# Patient Record
Sex: Female | Born: 1980 | Race: Black or African American | Hispanic: No | Marital: Married | Smoking: Never smoker
Health system: Southern US, Community
[De-identification: ages and names within clinical notes are randomized; demographics above are authoritative.]

## PROBLEM LIST (undated history)

## (undated) DIAGNOSIS — I10 Essential (primary) hypertension: Secondary | ICD-10-CM

## (undated) DIAGNOSIS — G43909 Migraine, unspecified, not intractable, without status migrainosus: Secondary | ICD-10-CM

## (undated) DIAGNOSIS — R7303 Prediabetes: Secondary | ICD-10-CM

## (undated) HISTORY — DX: Essential (primary) hypertension: I10

## (undated) HISTORY — DX: Migraine, unspecified, not intractable, without status migrainosus: G43.909

## (undated) HISTORY — DX: Prediabetes: R73.03

---

## 2013-01-02 ENCOUNTER — Ambulatory Visit: Payer: 59 | Admitting: *Deleted

## 2013-01-02 ENCOUNTER — Telehealth: Payer: Self-pay | Admitting: Family Medicine

## 2013-01-02 DIAGNOSIS — E049 Nontoxic goiter, unspecified: Secondary | ICD-10-CM

## 2013-01-02 DIAGNOSIS — Z833 Family history of diabetes mellitus: Secondary | ICD-10-CM

## 2013-01-02 DIAGNOSIS — R221 Localized swelling, mass and lump, neck: Secondary | ICD-10-CM

## 2013-01-02 DIAGNOSIS — R220 Localized swelling, mass and lump, head: Secondary | ICD-10-CM

## 2013-01-02 LAB — CBC WITH DIFFERENTIAL/PLATELET
Basophils Absolute: 0 10*3/uL (ref 0.0–0.1)
Basophils Relative: 1 % (ref 0–1)
Eosinophils Absolute: 0 10*3/uL (ref 0.0–0.7)
HCT: 40.3 % (ref 36.0–46.0)
Hemoglobin: 13.5 g/dL (ref 12.0–15.0)
Lymphs Abs: 1.9 10*3/uL (ref 0.7–4.0)
MCH: 26.8 pg (ref 26.0–34.0)
MCHC: 33.5 g/dL (ref 30.0–36.0)
Monocytes Relative: 9 % (ref 3–12)
Neutro Abs: 2.4 10*3/uL (ref 1.7–7.7)
Neutrophils Relative %: 49 % (ref 43–77)
Platelets: 353 10*3/uL (ref 150–400)
RBC: 5.03 MIL/uL (ref 3.87–5.11)

## 2013-01-02 NOTE — Telephone Encounter (Signed)
Spoke with Teresa Roach today in the clinic. For the past few mos she has felt like her thyroid area is enlarged. No problems swallowing and no lymphadenopathy. She feels well and has no h/o thyroid problems. She'd like to check some basic labs and, after getting results, likely get an u/s of her neck. I agree with this plan. We'll also look at her a1c since she has a FH of DM.  She declines a vitamin D, admitting that even if it were low she probably wouldn't take the supplement.

## 2013-01-03 ENCOUNTER — Encounter: Payer: Self-pay | Admitting: Family Medicine

## 2013-01-03 ENCOUNTER — Other Ambulatory Visit: Payer: Self-pay | Admitting: Family Medicine

## 2013-01-03 DIAGNOSIS — R7303 Prediabetes: Secondary | ICD-10-CM

## 2013-01-03 LAB — COMPREHENSIVE METABOLIC PANEL
ALT: 15 U/L (ref 0–35)
Albumin: 4.1 g/dL (ref 3.5–5.2)
Alkaline Phosphatase: 45 U/L (ref 39–117)
CO2: 27 mEq/L (ref 19–32)
Creat: 0.89 mg/dL (ref 0.50–1.10)
Glucose, Bld: 84 mg/dL (ref 70–99)
Potassium: 4.3 mEq/L (ref 3.5–5.3)
Sodium: 137 mEq/L (ref 135–145)
Total Bilirubin: 0.3 mg/dL (ref 0.3–1.2)
Total Protein: 7.5 g/dL (ref 6.0–8.3)

## 2013-01-03 LAB — HEMOGLOBIN A1C: Mean Plasma Glucose: 126 mg/dL — ABNORMAL HIGH (ref <117)

## 2013-01-03 LAB — T3, FREE: T3, Free: 3 pg/mL (ref 2.3–4.2)

## 2013-01-03 LAB — TSH: TSH: 1.38 u[IU]/mL (ref 0.350–4.500)

## 2013-01-03 MED ORDER — BLOOD GLUCOSE METER KIT: PACK | Status: DC

## 2013-01-09 ENCOUNTER — Other Ambulatory Visit: Payer: Self-pay | Admitting: Family Medicine

## 2013-01-09 DIAGNOSIS — J209 Acute bronchitis, unspecified: Secondary | ICD-10-CM

## 2013-01-09 MED ORDER — ALBUTEROL SULFATE HFA 108 (90 BASE) MCG/ACT IN AERS: 2.0000 | INHALATION_SPRAY | RESPIRATORY_TRACT | Status: DC | PRN

## 2013-01-09 MED ORDER — AZITHROMYCIN 250 MG PO TABS: ORAL_TABLET | ORAL | Status: DC

## 2013-01-09 MED ORDER — BENZONATATE 200 MG PO CAPS: 200.0000 mg | ORAL_CAPSULE | Freq: Two times a day (BID) | ORAL | Status: DC | PRN

## 2013-01-09 NOTE — Progress Notes (Signed)
Pt concerned about bronchitis, which she seems to get every year. She has had uri sx for a couple weeks and yesterday began coughing and feeling shob with exertion. No fevers or GI sx. She says nights are worse than days. In the past, a z pack and albuterol hfa have been required.   Lungs are ctab currently and she has good air movement.   I agree that she probably has gotten a secondary infection of bronchitis. I have sent in a z pack and albuterol inhaler. I have also sent in tessalon pearles. Sharmel will let me know if she is not feeling better soon or has any new concerns.

## 2013-01-16 ENCOUNTER — Other Ambulatory Visit: Payer: Self-pay | Admitting: Family Medicine

## 2013-01-16 DIAGNOSIS — R221 Localized swelling, mass and lump, neck: Secondary | ICD-10-CM

## 2013-01-17 ENCOUNTER — Ambulatory Visit (HOSPITAL_COMMUNITY)
Admission: RE | Admit: 2013-01-17 | Discharge: 2013-01-17 | Disposition: A | Discharge status: Home / Self Care | Payer: 59 | Source: Ambulatory Visit | Attending: Family Medicine | Admitting: Family Medicine

## 2013-01-17 DIAGNOSIS — E049 Nontoxic goiter, unspecified: Secondary | ICD-10-CM | POA: Insufficient documentation

## 2013-01-17 DIAGNOSIS — R599 Enlarged lymph nodes, unspecified: Secondary | ICD-10-CM | POA: Insufficient documentation

## 2013-01-17 DIAGNOSIS — R221 Localized swelling, mass and lump, neck: Secondary | ICD-10-CM

## 2013-01-17 DIAGNOSIS — R220 Localized swelling, mass and lump, head: Secondary | ICD-10-CM | POA: Insufficient documentation

## 2013-01-20 ENCOUNTER — Encounter: Payer: Self-pay | Admitting: Family Medicine

## 2013-01-21 ENCOUNTER — Telehealth: Payer: Self-pay | Admitting: Family Medicine

## 2013-01-21 ENCOUNTER — Encounter: Payer: Self-pay | Admitting: Family Medicine

## 2013-01-21 NOTE — Progress Notes (Signed)
Patient ID: Teresa Roach, female   DOB: [birth year], 32 y.o.   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 It has been going on for about 3 months total. She has had 2 mos of fatigue, prompting tsh to be checked. 1 month ago she got a uri and thought the full feeling in her throat might be related to this but when the feeling persisted she felt she should be looked at.   She denied any palpitations, GI sx, or weight loss. She is working on losing weight. She does endorse a long history of irregular periods, brittle nails, weight fluctuation, sleep problems, and heat intolerance.  A recent a1c showed that she is pre-diabetic and she is working on her weight and diet regarding this. She used to take Propranolol and has a h/o htn but is not on meds at this time.  O - Her HR was 95 and RR 16 Constitutional: She is oriented to person, place, and time. She appears well-developed and well-nourished.  HENT:  Neck: Normal range of motion. Neck supple. No obvious thyromegaly present. No obvious lymphadenopathy although left sided some shotty nodes present. Cardiovascular: Normal rate, regular rhythm and normal heart sounds.   Pulmonary/Chest: Effort normal and breath sounds normal.  Skin: Skin is warm and dry.  Psychiatric: She has a normal mood and affect. Her behavior is normal.   A/P Neck fullness - we checked her tsh and T4, which were wnl. We got an u/s which showed:  8 mm cystic nodule noted in left thyroid lobe. Thyroid gland appears  otherwise normal. Mild bilateral cervical adenopathy is noted that  most likely is benign and inflammatory in origin.  Given cystic nodule, We agreed that she should see ENT for further evaluation.  Teresa Roach will f/u with me in the office for a scheduled appointment or with another pcp of her choosing for primary care in the next few  months.

## 2013-01-30 ENCOUNTER — Encounter (INDEPENDENT_AMBULATORY_CARE_PROVIDER_SITE_OTHER): Payer: Self-pay

## 2013-01-30 ENCOUNTER — Ambulatory Visit (INDEPENDENT_AMBULATORY_CARE_PROVIDER_SITE_OTHER): Payer: 59 | Admitting: Otolaryngology

## 2013-01-30 DIAGNOSIS — D487 Neoplasm of uncertain behavior of other specified sites: Secondary | ICD-10-CM

## 2013-01-30 DIAGNOSIS — D449 Neoplasm of uncertain behavior of unspecified endocrine gland: Secondary | ICD-10-CM

## 2013-02-20 NOTE — Telephone Encounter (Signed)
Per pt request - see chart notes

## 2013-03-12 ENCOUNTER — Other Ambulatory Visit: Payer: Self-pay | Admitting: Family Medicine

## 2013-03-12 ENCOUNTER — Ambulatory Visit (HOSPITAL_COMMUNITY)
Admission: RE | Admit: 2013-03-12 | Discharge: 2013-03-12 | Disposition: A | Discharge status: Home / Self Care | Payer: 59 | Source: Ambulatory Visit | Attending: Family Medicine | Admitting: Family Medicine

## 2013-03-12 DIAGNOSIS — M25532 Pain in left wrist: Secondary | ICD-10-CM

## 2013-03-12 DIAGNOSIS — M25539 Pain in unspecified wrist: Secondary | ICD-10-CM | POA: Insufficient documentation

## 2013-03-12 NOTE — Progress Notes (Signed)
Pt fell on the ice this morning. Says she was leaving for work and fell on outstretched left hand. Exam revealed mild ttp over snuffbox. Slightly limited ROM. Minimal ttp over ant wrist, none on dorsum of wrist. No ttp over metacarpals and no pain with axial pressure. Normal cap refill and good pulses. 4/5 grip strength. Neuro intact.   We have discussed options and she would like to go ahead with XR. If negative will splint and see how it feels in a week - reXR if not better.

## 2013-03-13 ENCOUNTER — Ambulatory Visit (INDEPENDENT_AMBULATORY_CARE_PROVIDER_SITE_OTHER): Payer: 59 | Admitting: Otolaryngology

## 2013-03-13 DIAGNOSIS — R599 Enlarged lymph nodes, unspecified: Secondary | ICD-10-CM

## 2013-03-13 DIAGNOSIS — D449 Neoplasm of uncertain behavior of unspecified endocrine gland: Secondary | ICD-10-CM

## 2013-03-13 DIAGNOSIS — D487 Neoplasm of uncertain behavior of other specified sites: Secondary | ICD-10-CM

## 2013-03-19 ENCOUNTER — Other Ambulatory Visit: Payer: Self-pay | Admitting: Family Medicine

## 2013-03-19 DIAGNOSIS — I10 Essential (primary) hypertension: Secondary | ICD-10-CM

## 2013-03-19 MED ORDER — AMLODIPINE BESYLATE 2.5 MG PO TABS: 2.5000 mg | ORAL_TABLET | Freq: Every day | ORAL | Status: DC

## 2013-03-19 NOTE — Progress Notes (Signed)
Pt concerned today that her BP is high. Says when she was at the ENT's office last month it was elevated to 155/85 and she has a h/o HTN. In the past she took metoprolol but had difficulty loseing weight so does not want to take a BB anymore. She has been off BPmeds since May 2014. Today feels like her head is swimmy and has had a headache for a few days. No focal neuro sx. PE wnl. BP 142/84 manual. HR 90. She doesn't want to take ACE-I or HCTZ or ARB because she doesn't want to have labs, although we discussed that even with a CCB she would occasionally need at least a bmp checked. She understands this and we'll start norvasc 2.5mg  daily and see how she does. CMP in 11/14 was wnl.

## 2013-06-05 ENCOUNTER — Ambulatory Visit (INDEPENDENT_AMBULATORY_CARE_PROVIDER_SITE_OTHER): Payer: 59 | Admitting: Otolaryngology

## 2013-08-07 DIAGNOSIS — L0591 Pilonidal cyst without abscess: Secondary | ICD-10-CM | POA: Insufficient documentation

## 2013-08-11 LAB — HM HEPATITIS C SCREENING LAB: HM Hepatitis Screen: NEGATIVE

## 2015-01-11 IMAGING — US US SOFT TISSUE HEAD/NECK
1 series · 14 of 25 positions shown · non-contrast
Comparison: None.

CLINICAL DATA: Neck swelling.

EXAM:
THYROID ULTRASOUND
TECHNIQUE: Ultrasound examination of the thyroid gland and adjacent soft
tissues was performed.

[Series 1: us soft tissue head/neck · 0.04mm/px · 14 of 57 slices shown]
[im 1/57]
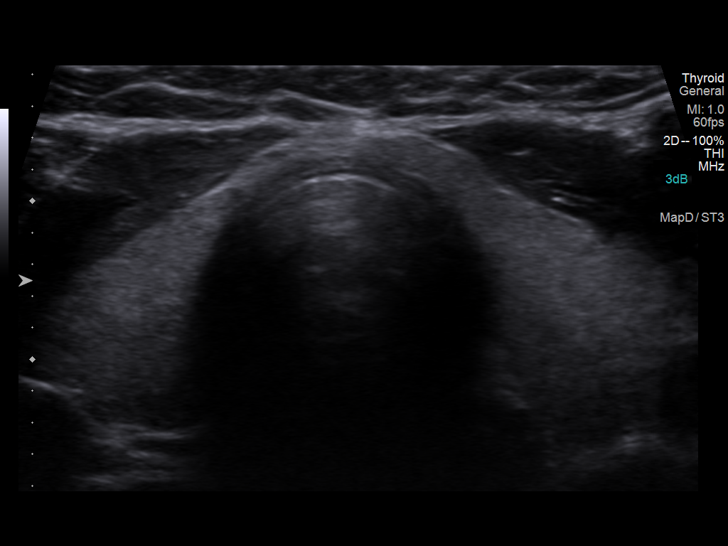
[im 5/57]
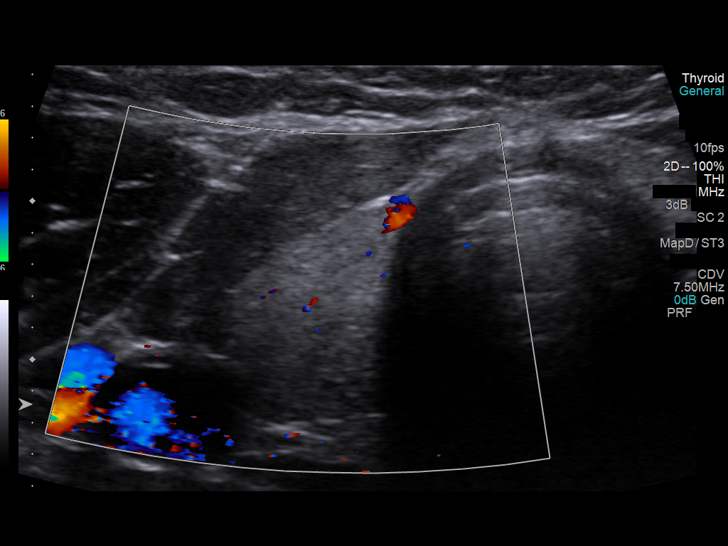
[im 10/57]
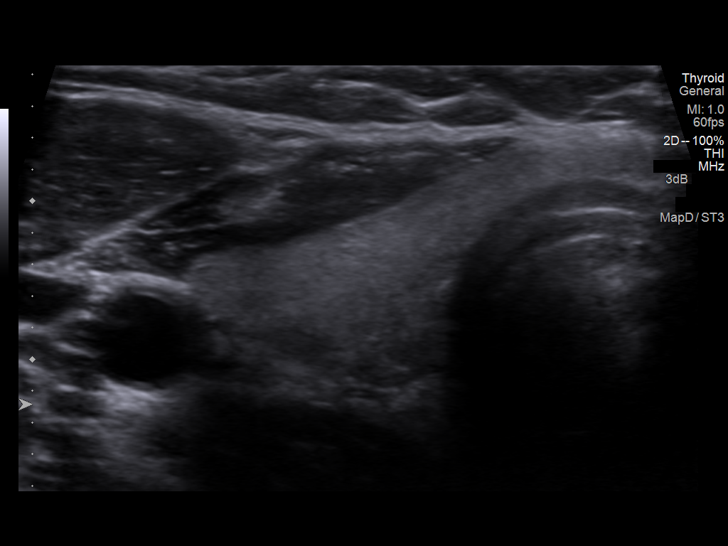
[im 15/57]
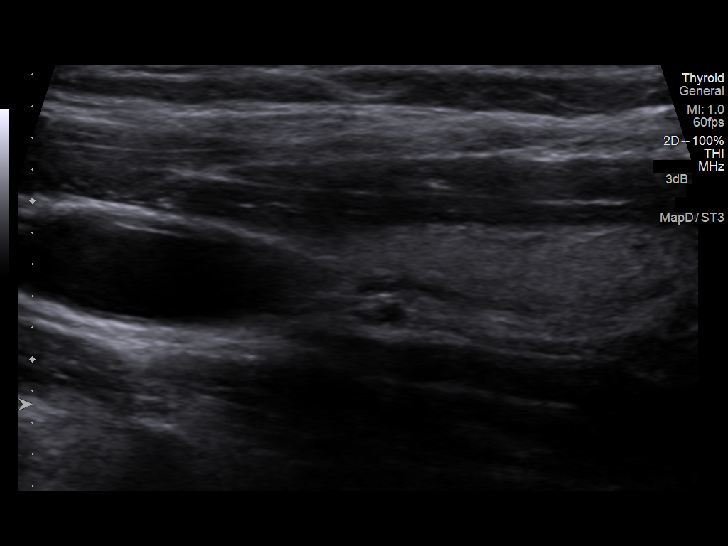
[im 19/57]
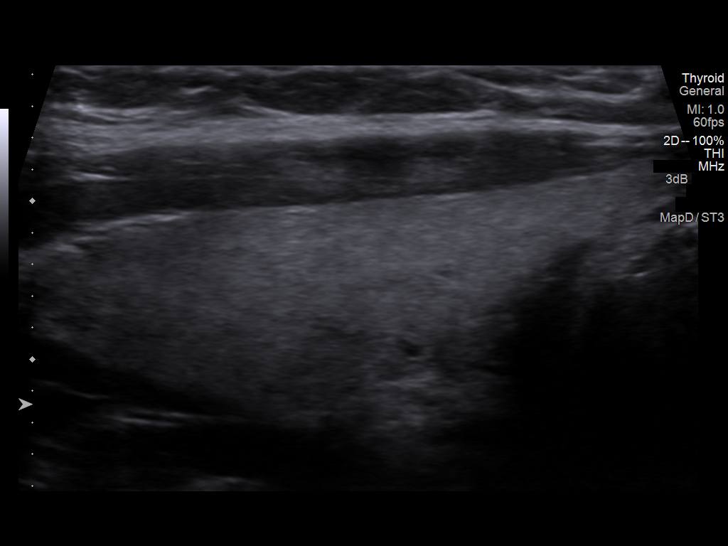
[im 22/57]
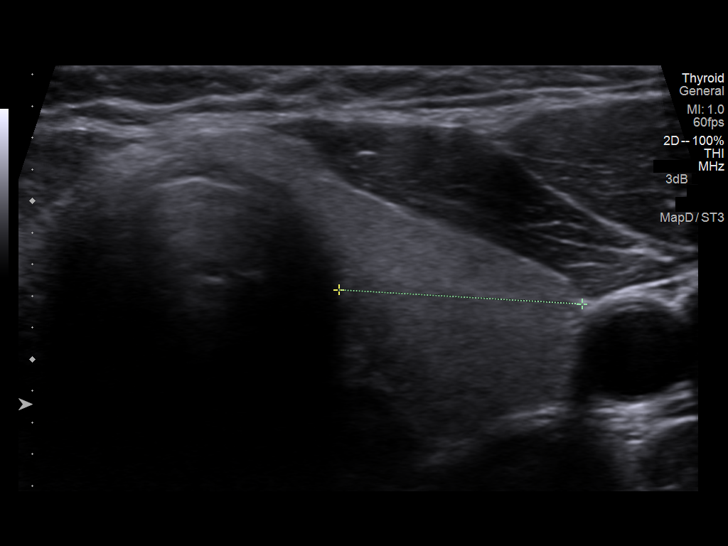
[im 26/57]
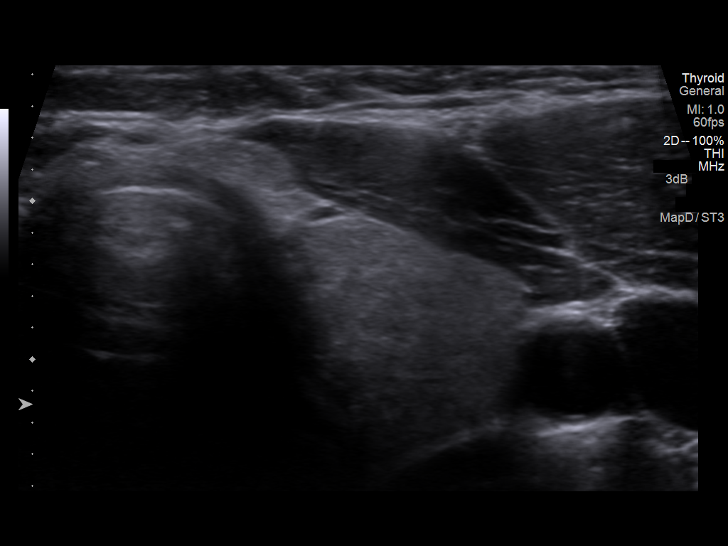
[im 31/57]
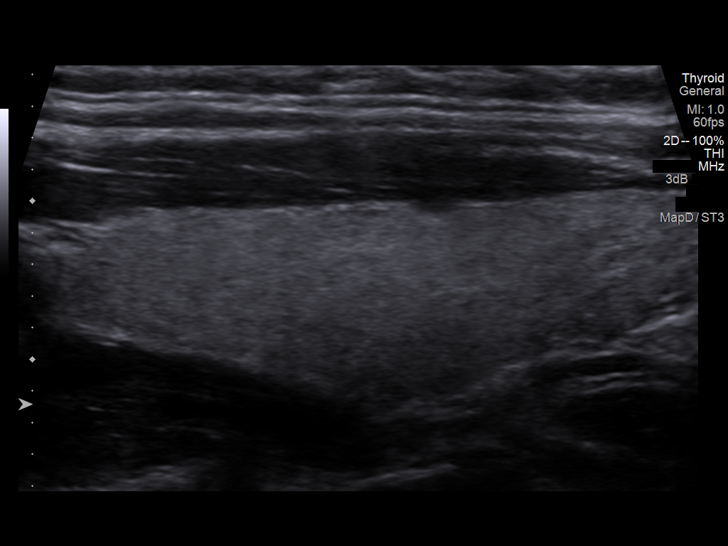
[im 36/57]
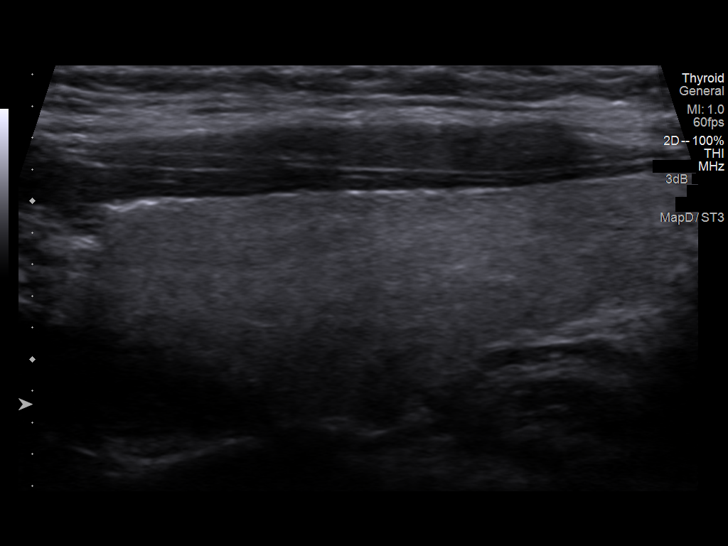
[im 38/57]
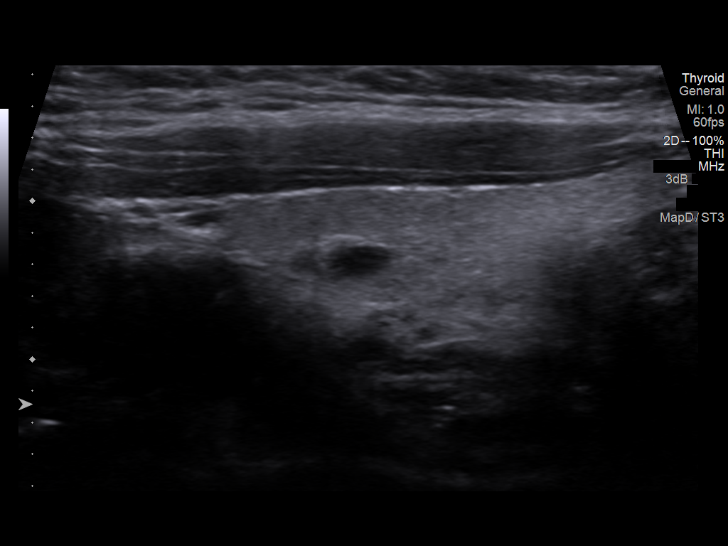
[im 43/57]
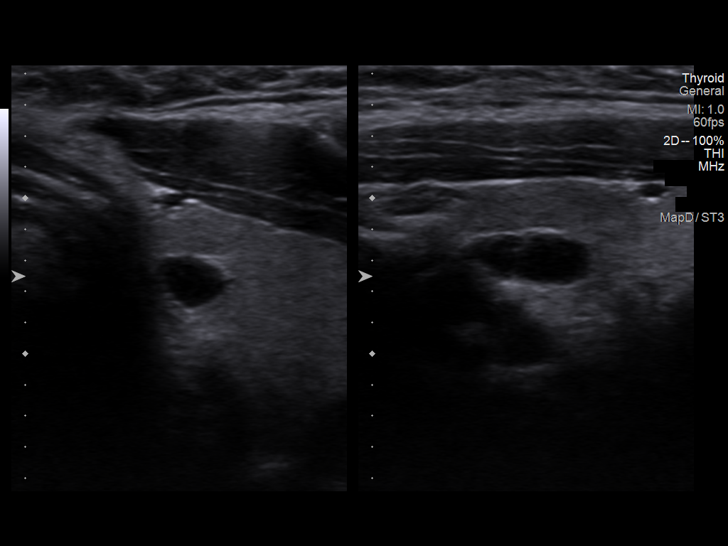
[im 47/57]
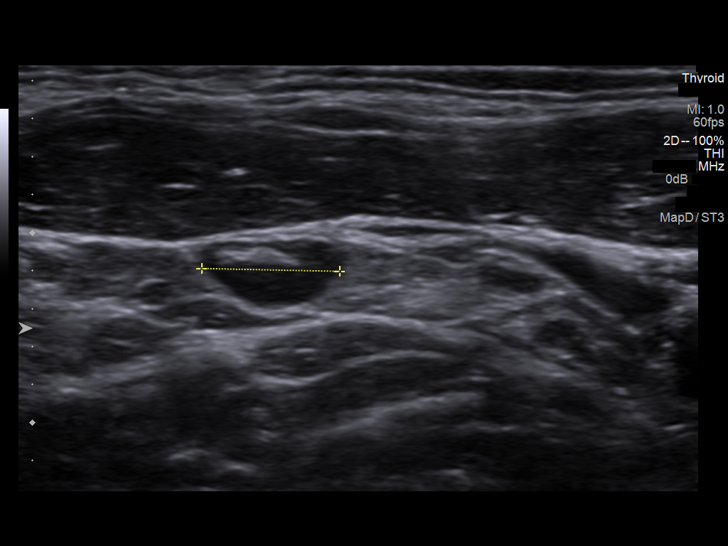
[im 52/57]
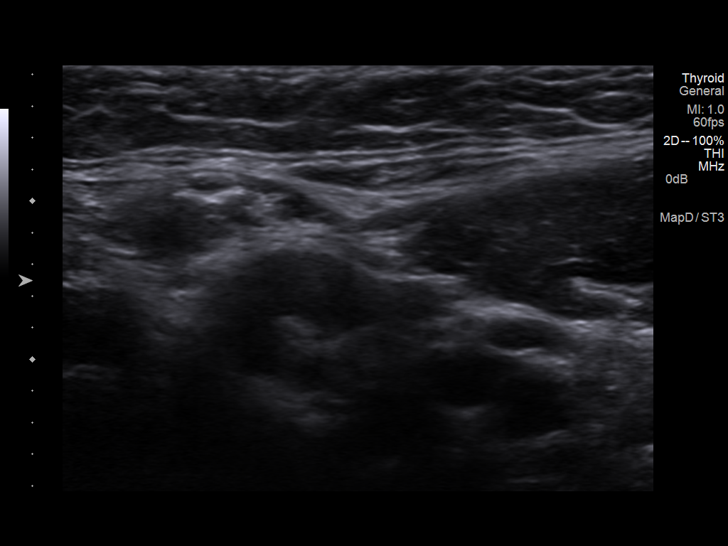
[im 57/57]
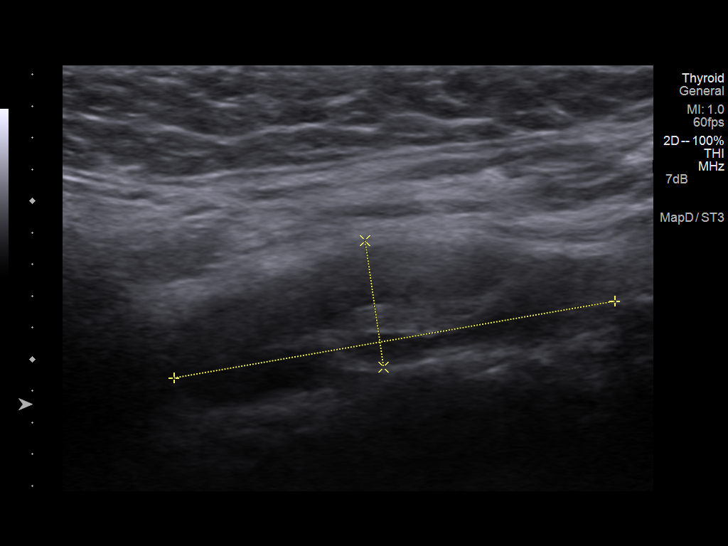

[14 of 25 positions shown; findings below may reference images not displayed]

FINDINGS: Right thyroid lobe

Measurements: 4.0 x 1.3 x 1.2 cm.  No nodules visualized.

Left thyroid lobe

Measurements: 4.1 x 1.5 x 1.3 cm. Cystic nodule measuring 8 x 4 x 3
mm is noted in the mid pole of the left thyroid lobe.

Isthmus

Thickness: 2 mm.  No nodules visualized.

Lymphadenopathy

Bilateral large lymph nodes are noted, with the largest on the right
measuring 1.8 x 0.7 x 0.3 cm. The largest on the left measures 2.8 x
1.1 x 0.8 cm. These lymph nodes do demonstrate fatty hila and most
likely are benign or inflammatory in origin, given that their minor
axis measures less than 1 cm.
IMPRESSION: 8 mm cystic nodule noted in left thyroid lobe. Thyroid gland appears
otherwise normal. Mild bilateral cervical adenopathy is noted that
most likely is benign and inflammatory in origin.

## 2016-03-24 DIAGNOSIS — M51369 Other intervertebral disc degeneration, lumbar region without mention of lumbar back pain or lower extremity pain: Secondary | ICD-10-CM | POA: Insufficient documentation

## 2016-03-24 DIAGNOSIS — M5416 Radiculopathy, lumbar region: Secondary | ICD-10-CM | POA: Insufficient documentation

## 2021-12-11 LAB — HM PAP SMEAR: HM Pap smear: NORMAL

## 2022-06-28 DIAGNOSIS — N938 Other specified abnormal uterine and vaginal bleeding: Secondary | ICD-10-CM | POA: Diagnosis not present

## 2022-06-28 DIAGNOSIS — D251 Intramural leiomyoma of uterus: Secondary | ICD-10-CM | POA: Diagnosis not present

## 2022-06-28 DIAGNOSIS — R9389 Abnormal findings on diagnostic imaging of other specified body structures: Secondary | ICD-10-CM | POA: Diagnosis not present

## 2022-06-28 DIAGNOSIS — R102 Pelvic and perineal pain: Secondary | ICD-10-CM | POA: Diagnosis not present

## 2022-07-18 DIAGNOSIS — N838 Other noninflammatory disorders of ovary, fallopian tube and broad ligament: Secondary | ICD-10-CM | POA: Diagnosis not present

## 2022-08-11 ENCOUNTER — Ambulatory Visit (HOSPITAL_BASED_OUTPATIENT_CLINIC_OR_DEPARTMENT_OTHER): Payer: Commercial Managed Care - PPO | Admitting: Student

## 2022-08-11 ENCOUNTER — Encounter (HOSPITAL_BASED_OUTPATIENT_CLINIC_OR_DEPARTMENT_OTHER): Payer: Self-pay | Admitting: Student

## 2022-08-11 ENCOUNTER — Ambulatory Visit (HOSPITAL_BASED_OUTPATIENT_CLINIC_OR_DEPARTMENT_OTHER): Payer: Commercial Managed Care - PPO

## 2022-08-11 DIAGNOSIS — M25561 Pain in right knee: Secondary | ICD-10-CM

## 2022-08-12 NOTE — Progress Notes (Signed)
Chief Complaint: Right knee pain     History of Present Illness:    Teresa Roach is a pleasant 42 y.o. female presenting today for evaluation of right knee pain.  Patient states that she has had pain in this knee for years.  She does report however that 4 days ago she came down on the knee awkwardly from a jump during a workout at burn [facility].  She did have immediate pain and difficulty weightbearing.  She is continued having a dull ache in the medial side of the knee that is moderate in severity.  She does note an increase in popping sensation as well as weakness and feeling of instability.  She has tried utilizing a knee brace as well as icing and Aleve.  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.   Surgical History:   None  PMH/PSH/Family History/Social History/Meds/Allergies:    Past Medical History:  Diagnosis Date   Hypertension    Migraine    Pre-diabetes    History reviewed. No pertinent surgical history. Social History   Socioeconomic History   Marital status: Married    Spouse name: Not on file   Number of children: Not on file   Years of education: Not on file   Highest education level: Not on file  Occupational History   Not on file  Tobacco Use   Smoking status: Not on file   Smokeless tobacco: Not on file  Substance and Sexual Activity   Alcohol use: Not on file    Comment: very rare drink   Drug use: No   Sexual activity: Yes  Other Topics Concern   Not on file  Social History Narrative   ** Merged History Encounter **       Social Determinants of Health   Financial Resource Strain: Not on file  Food Insecurity: Not on file  Transportation Needs: Not on file  Physical Activity: Not on file  Stress: Not on file  Social Connections: Not on file   Family History  Problem Relation Age of Onset   Diabetes Mother    Hyperlipidemia Mother    Hypertension Mother    Hyperlipidemia Father     Hypertension Father    Not on File Current Outpatient Medications  Medication Sig Dispense Refill   albuterol (PROVENTIL HFA;VENTOLIN HFA) 108 (90 BASE) MCG/ACT inhaler Inhale 2 puffs into the lungs every 4 (four) hours as needed for wheezing or shortness of breath. 1 Inhaler 1   amLODipine (NORVASC) 2.5 MG tablet Take 1 tablet (2.5 mg total) by mouth daily. 30 tablet 0   benzonatate (TESSALON) 200 MG capsule Take 1 capsule (200 mg total) by mouth 2 (two) times daily as needed for cough. 20 capsule 0   Blood Glucose Monitoring Suppl (BLOOD GLUCOSE METER) kit Use as instructed 1 each 0   No current facility-administered medications for this visit.   No results found.  Review of Systems:   A ROS was performed including pertinent positives and negatives as documented in the HPI.  Physical Exam :   Constitutional: NAD and appears stated age Neurological: Alert and oriented Psych: Appropriate affect and cooperative There were no vitals taken for this visit.   Comprehensive Musculoskeletal Exam:    Right knee appears mildly swollen with no effusion appreciated.  Positive  medial joint line tenderness.  Active range of motion from 0 to 130 degrees.  No laxity with varus or valgus stress.  Negative Lachman.  Mildly positive McMurray's.  Imaging:   Xray (right knee 4 views): Negative for bony abnormality   I personally reviewed and interpreted the radiographs.   Assessment:   42 y.o. female with history of right knee pain exacerbated by an injury 4 days ago.  She has been experiencing some mechanical symptoms as well as medial joint line pain and swelling.  Due to this I am concerned for a possible medial meniscus injury and therefore believe that an MRI of the knee is indicated at this time.  In the meantime I would like for her to continue with conservative treatment including RICE therapy and anti-inflammatories as well as a brace for added stability.  Will plan to have her follow-up in  approximately 4 weeks for MRI review.  Plan :    -Order MRI of the right knee in follow-up in 4 weeks to discuss results     I personally saw and evaluated the patient, and participated in the management and treatment plan.  Hazle Nordmann, PA-C Orthopedics  This document was dictated using Conservation officer, historic buildings. A reasonable attempt at proof reading has been made to minimize errors.

## 2022-08-19 ENCOUNTER — Ambulatory Visit (INDEPENDENT_AMBULATORY_CARE_PROVIDER_SITE_OTHER): Payer: Commercial Managed Care - PPO

## 2022-08-19 DIAGNOSIS — M25561 Pain in right knee: Secondary | ICD-10-CM | POA: Diagnosis not present

## 2022-08-19 DIAGNOSIS — R609 Edema, unspecified: Secondary | ICD-10-CM | POA: Diagnosis not present

## 2022-08-19 DIAGNOSIS — M7121 Synovial cyst of popliteal space [Baker], right knee: Secondary | ICD-10-CM | POA: Diagnosis not present

## 2022-08-19 DIAGNOSIS — M25461 Effusion, right knee: Secondary | ICD-10-CM | POA: Diagnosis not present

## 2022-08-24 ENCOUNTER — Telehealth (HOSPITAL_BASED_OUTPATIENT_CLINIC_OR_DEPARTMENT_OTHER): Payer: Self-pay

## 2022-08-24 NOTE — Telephone Encounter (Signed)
LVM for pt to cb to schedule.

## 2022-08-24 NOTE — Telephone Encounter (Signed)
-----   Message from Amador Cunas, PA-C sent at 08/24/2022  2:25 PM EDT ----- Could you see if she would like to get scheduled with Dr B to discuss MRI results? ----- Message ----- From: Interface, Rad Results In Sent: 08/24/2022  10:36 AM EDT To: Amador Cunas, PA-C

## 2022-08-25 ENCOUNTER — Ambulatory Visit (HOSPITAL_BASED_OUTPATIENT_CLINIC_OR_DEPARTMENT_OTHER): Payer: Commercial Managed Care - PPO | Admitting: Student

## 2022-08-28 ENCOUNTER — Ambulatory Visit (HOSPITAL_BASED_OUTPATIENT_CLINIC_OR_DEPARTMENT_OTHER): Payer: Commercial Managed Care - PPO | Admitting: Student

## 2022-09-08 DIAGNOSIS — Z6836 Body mass index (BMI) 36.0-36.9, adult: Secondary | ICD-10-CM | POA: Diagnosis not present

## 2022-09-08 DIAGNOSIS — I10 Essential (primary) hypertension: Secondary | ICD-10-CM | POA: Diagnosis not present

## 2022-09-21 DIAGNOSIS — E7800 Pure hypercholesterolemia, unspecified: Secondary | ICD-10-CM | POA: Diagnosis not present

## 2022-09-21 DIAGNOSIS — E559 Vitamin D deficiency, unspecified: Secondary | ICD-10-CM | POA: Diagnosis not present

## 2022-09-21 DIAGNOSIS — Z6835 Body mass index (BMI) 35.0-35.9, adult: Secondary | ICD-10-CM | POA: Diagnosis not present

## 2022-09-26 DIAGNOSIS — Z1331 Encounter for screening for depression: Secondary | ICD-10-CM | POA: Diagnosis not present

## 2022-09-26 DIAGNOSIS — Z01419 Encounter for gynecological examination (general) (routine) without abnormal findings: Secondary | ICD-10-CM | POA: Diagnosis not present

## 2022-09-29 ENCOUNTER — Ambulatory Visit (HOSPITAL_BASED_OUTPATIENT_CLINIC_OR_DEPARTMENT_OTHER): Payer: Commercial Managed Care - PPO

## 2022-09-29 ENCOUNTER — Ambulatory Visit (HOSPITAL_BASED_OUTPATIENT_CLINIC_OR_DEPARTMENT_OTHER): Payer: Commercial Managed Care - PPO | Admitting: Orthopaedic Surgery

## 2022-09-29 DIAGNOSIS — M25552 Pain in left hip: Secondary | ICD-10-CM | POA: Diagnosis not present

## 2022-09-29 DIAGNOSIS — M67959 Unspecified disorder of synovium and tendon, unspecified thigh: Secondary | ICD-10-CM | POA: Diagnosis not present

## 2022-09-29 NOTE — Progress Notes (Signed)
Chief Complaint: Right knee pain, left hip pain     History of Present Illness:    Teresa Roach is a 42 y.o. female presents today with ongoing right knee pain.  This is in addition to some tenderness over the lateral aspect of the hip.  With regard to the right knee she does feel like at burn [facility] she did have an incident with subsequent pain feeling like the knee buckling giving out.  Denies any frank history of patella instability.  She is here today for MRI discussion as well as discussion of ongoing left hip.  She is having a hard time laying directly on the side.  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    Surgical History:   None  PMH/PSH/Family History/Social History/Meds/Allergies:    Past Medical History:  Diagnosis Date   Hypertension    Migraine    Pre-diabetes    No past surgical history on file. Social History   Socioeconomic History   Marital status: Married    Spouse name: Not on file   Number of children: Not on file   Years of education: Not on file   Highest education level: Not on file  Occupational History   Not on file  Tobacco Use   Smoking status: Not on file   Smokeless tobacco: Not on file  Substance and Sexual Activity   Alcohol use: Not on file    Comment: very rare drink   Drug use: No   Sexual activity: Yes  Other Topics Concern   Not on file  Social History Narrative   ** Merged History Encounter **       Social Determinants of Health   Financial Resource Strain: Low Risk  (10/25/2021)   Received from [facility], [facility]   Overall Financial Resource Strain (CARDIA)    Difficulty of Paying Living Expenses: Not hard at all  Food Insecurity: No Food Insecurity (10/25/2021)   Received from [facility], [facility]   Hunger Vital Sign    Worried About Running Out of Food in the Last Year: Never true    Ran Out of Food in the Last Year: Never true  Transportation  Needs: Not on file  Physical Activity: Sufficiently Active (10/25/2021)   Received from [facility], [facility]   Exercise Vital Sign    Days of Exercise per Week: 6 days    Minutes of Exercise per Session: 50 min  Stress: No Stress Concern Present (10/25/2021)   Received from [facility], [facility] - Occupational Stress Questionnaire    Feeling of Stress : Not at all  Social Connections: Socially Integrated (10/25/2021)   Received from [facility], [facility]   Social Network    How would you rate your social network (family, work, friends)?: Good participation with social networks   Family History  Problem Relation Age of Onset   Diabetes Mother    Hyperlipidemia Mother    Hypertension Mother    Hyperlipidemia Father    Hypertension Father    Not on File Current Outpatient Medications  Medication Sig Dispense Refill   albuterol (PROVENTIL HFA;VENTOLIN HFA) 108 (90 BASE) MCG/ACT inhaler Inhale 2 puffs into the lungs every 4 (four) hours as needed for wheezing or  shortness of breath. 1 Inhaler 1   amLODipine (NORVASC) 2.5 MG tablet Take 1 tablet (2.5 mg total) by mouth daily. 30 tablet 0   benzonatate (TESSALON) 200 MG capsule Take 1 capsule (200 mg total) by mouth 2 (two) times daily as needed for cough. 20 capsule 0   Blood Glucose Monitoring Suppl (BLOOD GLUCOSE METER) kit Use as instructed 1 each 0   No current facility-administered medications for this visit.   No results found.  Review of Systems:   A ROS was performed including pertinent positives and negatives as documented in the HPI.  Physical Exam :   Constitutional: NAD and appears stated age Neurological: Alert and oriented Psych: Appropriate affect and cooperative There were no vitals taken for this visit.   Comprehensive Musculoskeletal Exam:    Tenderness palpation about the lateral trochanter at the vastus lateralis ridge.  There is some pain with  resisted abduction.  With regard to the right knee she does not have significant lateral translation with 2 quadrants of medial and lateral motion.  No joint line tenderness.  Range of motion is from -5 to 130 degrees  Imaging:   Xray (4 views right knee, 3 views left hip): Normal right knee, calcific deposition at the insertion of the gluteus medius  MRI (right knee): Small patellar chondral lesion centrally with underlying bony bruise  I personally reviewed and interpreted the radiographs.   Assessment:   42 y.o. 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  I did describe that ultimately I do believe this may be related to a direct contusion type mechanism of the patella.  That being said I would recommend some hip and core strengthening to assist with pain relief of the knee.  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  I do believe that she may be favoring this right side as a result of left hip issues.  At this visit I did recommend a left hip injection at the site of calcific deposition.  Will plan for this.  Plan :    -Left hip ultrasound-guided injection performed after verbal consent        I personally saw and evaluated the patient, and participated in the management and treatment plan.  Huel Cote, MD Attending Physician, Orthopedic Surgery  This document was dictated using Dragon voice recognition software. A reasonable attempt at proof reading has been made to minimize errors.

## 2022-10-04 DIAGNOSIS — E559 Vitamin D deficiency, unspecified: Secondary | ICD-10-CM | POA: Diagnosis not present

## 2022-10-04 DIAGNOSIS — Z6836 Body mass index (BMI) 36.0-36.9, adult: Secondary | ICD-10-CM | POA: Diagnosis not present

## 2022-10-05 DIAGNOSIS — R928 Other abnormal and inconclusive findings on diagnostic imaging of breast: Secondary | ICD-10-CM | POA: Diagnosis not present

## 2022-10-05 DIAGNOSIS — D242 Benign neoplasm of left breast: Secondary | ICD-10-CM | POA: Insufficient documentation

## 2022-10-05 DIAGNOSIS — R92343 Mammographic extreme density, bilateral breasts: Secondary | ICD-10-CM | POA: Diagnosis not present

## 2022-10-05 DIAGNOSIS — R92333 Mammographic heterogeneous density, bilateral breasts: Secondary | ICD-10-CM | POA: Diagnosis not present

## 2022-10-17 DIAGNOSIS — R928 Other abnormal and inconclusive findings on diagnostic imaging of breast: Secondary | ICD-10-CM | POA: Diagnosis not present

## 2022-10-17 DIAGNOSIS — N6022 Fibroadenosis of left breast: Secondary | ICD-10-CM | POA: Diagnosis not present

## 2022-10-17 DIAGNOSIS — D242 Benign neoplasm of left breast: Secondary | ICD-10-CM | POA: Diagnosis not present

## 2022-10-25 DIAGNOSIS — M48062 Spinal stenosis, lumbar region with neurogenic claudication: Secondary | ICD-10-CM | POA: Diagnosis not present

## 2022-10-25 DIAGNOSIS — M5416 Radiculopathy, lumbar region: Secondary | ICD-10-CM | POA: Diagnosis not present

## 2022-10-25 DIAGNOSIS — M5136 Other intervertebral disc degeneration, lumbar region: Secondary | ICD-10-CM | POA: Diagnosis not present

## 2022-11-01 ENCOUNTER — Ambulatory Visit
Payer: Commercial Managed Care - PPO | Attending: Orthopaedic Surgery | Admitting: Rehabilitative and Restorative Service Providers"

## 2022-11-01 ENCOUNTER — Other Ambulatory Visit: Payer: Self-pay

## 2022-11-01 ENCOUNTER — Encounter: Payer: Self-pay | Admitting: Rehabilitative and Restorative Service Providers"

## 2022-11-01 DIAGNOSIS — M25561 Pain in right knee: Secondary | ICD-10-CM | POA: Diagnosis not present

## 2022-11-01 DIAGNOSIS — M6281 Muscle weakness (generalized): Secondary | ICD-10-CM | POA: Diagnosis not present

## 2022-11-01 DIAGNOSIS — M67959 Unspecified disorder of synovium and tendon, unspecified thigh: Secondary | ICD-10-CM | POA: Insufficient documentation

## 2022-11-01 DIAGNOSIS — M25552 Pain in left hip: Secondary | ICD-10-CM | POA: Diagnosis not present

## 2022-11-01 NOTE — Therapy (Signed)
OUTPATIENT PHYSICAL THERAPY LOWER EXTREMITY EVALUATION   Patient Name: Teresa Roach MRN: [id number] DOB:1980-11-29, 42 y.o., female Today's Date: 11/01/2022  END OF SESSION:  PT End of Session - 11/01/22 1201     Visit Number 1    Number of Visits 12    Date for PT Re-Evaluation 12/31/22    Authorization Type [place] traditional plan copay $20    PT Start Time 1150    PT Stop Time 1233    PT Time Calculation (min) 43 min    Activity Tolerance Patient tolerated treatment well    Behavior During Therapy WFL for tasks assessed/performed            Past Medical History:  Diagnosis Date   Hypertension    Migraine    Pre-diabetes    History reviewed. No pertinent surgical history. There are no problems to display for this patient.  REFERRING PROVIDER:  Huel Cote, MD   REFERRING DIAG: [phone] (ICD-10-CM) - Tendinopathy of gluteus medius  THERAPY DIAG:  Pain in left hip  Acute pain of right knee  Muscle weakness (generalized)  Rationale for Evaluation and Treatment: Rehabilitation  ONSET DATE: 09/29/22  SUBJECTIVE:  SUBJECTIVE STATEMENT: The patient has h/ low back pain with L sciatic pain-- she has been getting injections L SI region x past 4 years (frequency of 1x/year). In May/June timeframe, she experienced a R knee injury during [employer] routine.She initially has weakness and sensation of instability that has improved. She has returned to exercise routine 5-6 days/week (with knee brace), but has had to compensate due to R knee apprehension/weakness. She developed L greater trochanteric pain and had an injection last week in glut med. It improved x 1 week, but pain has returned.   PERTINENT HISTORY: See above PAIN:  Are you having pain? Yes: NPRS scale: 0/10 at rest; gets pain during eval with eccentric step downs Pain location: R knee, L hip (only with palpation) Pain description: R knee-- apprehension, discomfort Aggravating factors: jumping,  step ups (avoids at gym), forward squats (avoids) Relieving factors: no pain at rest  PRECAUTIONS: None  WEIGHT BEARING RESTRICTIONS: No  FALLS:  Has patient fallen in last 6 months? No  LIVING ENVIRONMENT: Lives with: lives with their family Lives in: House/apartment  OCCUPATION: Primary care MD for [employer]  PLOF: Independent  PATIENT GOALS: be able to lay on left hip and improve comfort with the right knee  OBJECTIVE:   DIAGNOSTIC FINDINGS: Xray (4 views right knee, 3 views left hip): Normal right knee, calcific deposition at the insertion of the gluteus medius  MRI (right knee): Small patellar chondral lesion centrally with underlying bony bruise  PATIENT SURVEYS:  FOTO 91%  MUSCLE LENGTH: Hamstrings: bilateral tightness  POSTURE: No Significant postural limitations  PALPATION: Tender to palpation L greater trochanter; no pain with sciatic notch palpation L, no pain with SI palpation  LOWER EXTREMITY ROM: Active ROM Right eval Left eval  Hip flexion WFLs t/o WFLs t/o  Hip extension    Hip abduction    Hip adduction    Hip internal rotation    Hip external rotation    Knee flexion    Knee extension    Ankle dorsiflexion    Ankle plantarflexion    Ankle inversion    Ankle eversion     (Blank rows = not tested)  LOWER EXTREMITY MMT: MMT Right eval Left eval  Hip flexion 5/5 5/5  Hip extension 5/5 4+/5  Hip abduction  5/5 5/5  Hip adduction    Hip internal rotation    Hip external rotation    Knee flexion 5/5 5/5  Knee extension 5/5 5/5  Ankle dorsiflexion    Ankle plantarflexion 5/5 5/5  Ankle inversion    Ankle eversion     (Blank rows = not tested)  LOWER EXTREMITY SPECIAL TESTS:  Hip special tests: Single limb stance 15 seconds without pain Heel raises 20 reps R and L with fingertip touch-- notes some fatigue Wall sit-- gets some apprehension Step downs laterally from 6" step-- apprehension, trunk rotation to avoid loading R leg--  painful Step downs anteriorly from 4" step with L foot-- painful in R knee Lunges-- avoids forward lunge on the R side  GAIT: Comments: WNLs gait   [facility] Adult PT Treatment:                                                DATE: 11/01/22 Therapeutic Exercise: See HEP below Wall sits-- did not tolerate for home Step ups/downs-- did not tolerate for home  PATIENT EDUCATION:  Education details: HEP Person educated: Patient Education method: Explanation, Demonstration, and Handouts Education comprehension: verbalized understanding and returned demonstration  HOME EXERCISE PROGRAM: Access Code: [email or web address] URL: [email or web address] Date: 11/01/2022 Prepared by: Margretta Ditty  Exercises - Supine Piriformis Stretch  - 2 x daily - 7 x weekly - 1 sets - 10 reps - Seated Table Hamstring Stretch  - 2 x daily - 7 x weekly - 1 sets - 2-3 reps - 20-30 seconds hold - Forward T  - 2 x daily - 7 x weekly - 1 sets - 10 reps - Standing Isometric Hip Abduction with Mini Squat on Wall  - 2 x daily - 7 x weekly - 1 sets - 10 reps  ASSESSMENT:  CLINICAL IMPRESSION: Patient is a 42 y.o. female who was seen today for physical therapy evaluation and treatment for R knee injury/pain and L hip pain. The patient is strong with MMT, however eccentric lowering with R LE provokes apprehension and pain. Patient is continuing to exercise with modifications and may be offloading the R LE, leading to L hip pain. L hip is painful with rolling onto the L side during sleep, or when sidesitting. Patient also has tight bilateral hamstrings. PT to address deficits to reduce compensatory movement patterns at the gym and improve symmetry in order to reduce pain.   OBJECTIVE IMPAIRMENTS: decreased activity tolerance, decreased strength, impaired flexibility, and pain.   ACTIVITY LIMITATIONS: squatting   PARTICIPATION LIMITATIONS:  gym routine  PERSONAL FACTORS: 1 comorbidity: HTN  are also affecting patient's  functional outcome.   REHAB POTENTIAL: Good  CLINICAL DECISION MAKING: Stable/uncomplicated  EVALUATION COMPLEXITY: Low   GOALS: Goals reviewed with patient? Yes  SHORT TERM GOALS: Target date: 12/01/22 The patient will be indep with HEP. Baseline: initiated at eval Goal status: INITIAL  2.  The patient will tolerate step down anteriorly from 4" step. Baseline:  Painful and apprehension for R knee Goal status: INITIAL  3.  The patient will be able to tolerate L sidelying to demo reduced pain L hip. Baseline: Cannot sleep on L side. Goal status: INITIAL  LONG TERM GOALS: Target date: 12/31/22  The patient will be indep with progression of HEP. Baseline:  initiated at eval Goal status: INITIAL  2.  The patient  will demonstrate forward lunge R LE without maladaptive compensatory movements. Baseline:  Cannot tolerate today Goal status: INITIAL  3.  The patient will perform wall sit without R knee pain/apprehension. Baseline:  Painful at eval. Goal status: INITIAL  4.  The patient will demo exercise modifications to reduce compensations during class. Baseline:  Loads L LE more -- avoids step ups, step downs, jumping, and lunges with R Goal status: INITIAL   PLAN:  PT FREQUENCY: 1-2x/week  PT DURATION: 8 weeks  PLANNED INTERVENTIONS: Therapeutic exercises, Therapeutic activity, Neuromuscular re-education, Balance training, Gait training, Patient/Family education, Self Care, Joint mobilization, Dry Needling, Electrical stimulation, Cryotherapy, Moist heat, Taping, and Manual therapy  PLAN FOR NEXT SESSION: Check HEP, work on eccentric control of the R knee, L glut medius strengthening, STM as needed, reduce compensations during gym routine.   Wirt Hemmerich, PT 11/01/2022, 3:39 PM

## 2022-11-08 DIAGNOSIS — Z6835 Body mass index (BMI) 35.0-35.9, adult: Secondary | ICD-10-CM | POA: Diagnosis not present

## 2022-11-08 DIAGNOSIS — E559 Vitamin D deficiency, unspecified: Secondary | ICD-10-CM | POA: Diagnosis not present

## 2022-11-08 DIAGNOSIS — E7800 Pure hypercholesterolemia, unspecified: Secondary | ICD-10-CM | POA: Diagnosis not present

## 2022-11-13 ENCOUNTER — Ambulatory Visit: Payer: Commercial Managed Care - PPO

## 2022-11-13 DIAGNOSIS — M25552 Pain in left hip: Secondary | ICD-10-CM | POA: Diagnosis not present

## 2022-11-13 DIAGNOSIS — M25561 Pain in right knee: Secondary | ICD-10-CM | POA: Diagnosis not present

## 2022-11-13 DIAGNOSIS — M67959 Unspecified disorder of synovium and tendon, unspecified thigh: Secondary | ICD-10-CM | POA: Diagnosis not present

## 2022-11-13 DIAGNOSIS — M6281 Muscle weakness (generalized): Secondary | ICD-10-CM

## 2022-11-13 NOTE — Therapy (Addendum)
OUTPATIENT PHYSICAL THERAPY LOWER EXTREMITY TREATMENT PHYSICAL THERAPY DISCHARGE SUMMARY  Visits from Start of Care: 2  Current functional level related to goals / functional outcomes: No formal re-assessment of goals.    Remaining deficits: Status unknown   Education / Equipment: N/Aa   Patient agrees to discharge. Patient goals were not met. Patient is being discharged due to not returning since the last visit.   Patient Name: Teresa Roach MRN: [id number] DOB:1980-10-06, 42 y.o., female Today's Date: 11/13/2022  END OF SESSION:  PT End of Session - 11/13/22 1148     Visit Number 2    Number of Visits 12    Date for PT Re-Evaluation 12/31/22    Authorization Type [place] traditional plan copay $20    PT Start Time 1147    PT Stop Time 1230    PT Time Calculation (min) 43 min    Activity Tolerance Patient tolerated treatment well    Behavior During Therapy [facility] for tasks assessed/performed             Past Medical History:  Diagnosis Date   Hypertension    Migraine    Pre-diabetes    History reviewed. No pertinent surgical history. There are no problems to display for this patient.  REFERRING PROVIDER:  Huel Cote, MD   REFERRING DIAG: [phone] (ICD-10-CM) - Tendinopathy of gluteus medius  THERAPY DIAG:  Pain in left hip  Acute pain of right knee  Muscle weakness (generalized)  Rationale for Evaluation and Treatment: Rehabilitation  ONSET DATE: 09/29/22  SUBJECTIVE:  SUBJECTIVE STATEMENT: "I'm ok. Still some pain on the left side." She has a back injection for her stenosis scheduled next week.   PERTINENT HISTORY: See above PAIN:  Are you having pain? Yes: NPRS scale:2/10 Pain location: Lt hip Pain description: "pull" Aggravating factors: jumping, step ups (avoids at gym), forward squats (avoids) Relieving factors: no pain at rest  PRECAUTIONS: None  WEIGHT BEARING RESTRICTIONS: No  FALLS:  Has patient fallen in last 6 months?  No   OCCUPATION: Primary care MD for [employer]    PATIENT GOALS: be able to lay on left hip and improve comfort with the right knee  OBJECTIVE:   DIAGNOSTIC FINDINGS: Xray (4 views right knee, 3 views left hip): Normal right knee, calcific deposition at the insertion of the gluteus medius  MRI (right knee): Small patellar chondral lesion centrally with underlying bony bruise  PATIENT SURVEYS:  FOTO 91%  MUSCLE LENGTH: Hamstrings: bilateral tightness  POSTURE: No Significant postural limitations  PALPATION: Tender to palpation L greater trochanter; no pain with sciatic notch palpation L, no pain with SI palpation  LOWER EXTREMITY ROM: Active ROM Right eval Left eval  Hip flexion WFLs t/o WFLs t/o  Hip extension    Hip abduction    Hip adduction    Hip internal rotation    Hip external rotation    Knee flexion    Knee extension    Ankle dorsiflexion    Ankle plantarflexion    Ankle inversion    Ankle eversion     (Blank rows = not tested)  LOWER EXTREMITY MMT: MMT Right eval Left eval  Hip flexion 5/5 5/5  Hip extension 5/5 4+/5  Hip abduction 5/5 5/5  Hip adduction    Hip internal rotation    Hip external rotation    Knee flexion 5/5 5/5  Knee extension 5/5 5/5  Ankle dorsiflexion    Ankle plantarflexion 5/5 5/5  Ankle inversion  Ankle eversion     (Blank rows = not tested) LUMBAR ROM:   Active  A/PROM  11/13/22  Flexion WNL  Extension 25% limited LBP  Right lateral flexion WNL  Left lateral flexion WNL  Right rotation WNL  Left rotation WNL   (Blank rows = not tested)  LOWER EXTREMITY SPECIAL TESTS:  Hip special tests: Single limb stance 15 seconds without pain Heel raises 20 reps R and L with fingertip touch-- notes some fatigue Wall sit-- gets some apprehension Step downs laterally from 6" step-- apprehension, trunk rotation to avoid loading R leg-- painful Step downs anteriorly from 4" step with L foot-- painful in R knee Lunges--  avoids forward lunge on the R side  GAIT: Comments: WNLs gait  [facility] Adult PT Treatment:                                                DATE: 11/13/22 Therapeutic Exercise: IT band stretch with strap x 1 min HS stretch with strap x 1 min Hip flexor lunge stretch x 1 min Sidelying leg taps 2 x 10  SLR with square trace 2 x 10  Hip bridge x 10  Fire hydrant 2 x 10  Standing 2025/03/19x 10  Dead lift with red band 2 x 10  Manual Therapy: Lt hip MWM IR/ER   [facility] Adult PT Treatment:                                                DATE: 11/01/22 Therapeutic Exercise: See HEP below Wall sits-- did not tolerate for home Step ups/downs-- did not tolerate for home  PATIENT EDUCATION:  Education details: HEP Person educated: Patient Education method: Explanation, Demonstration, and Handouts Education comprehension: verbalized understanding and returned demonstration  HOME EXERCISE PROGRAM: Access Code: [email or web address] URL: [email or web address] Date: 11/13/2022 Prepared by: Letitia Libra  Exercises - Supine Piriformis Stretch  - 2 x daily - 7 x weekly - 1 sets - 10 reps - Seated Table Hamstring Stretch  - 2 x daily - 7 x weekly - 1 sets - 2-3 reps - 20-30 seconds hold - Forward T  - 2 x daily - 7 x weekly - 1 sets - 10 reps - Standing Isometric Hip Abduction with Mini Squat on Wall  - 2 x daily - 7 x weekly - 1 sets - 10 reps - Half Kneeling Hip Flexor Stretch  - 1 x daily - 7 x weekly - 3 sets - 30 sec  hold - Sidelying Diagonal Hip Abduction  - 1 x daily - 7 x weekly - 2 sets - 10 reps - Supine Active Straight Leg Raise  - 1 x daily - 7 x weekly - 2 sets - 10 reps - Deadlift with Resistance  - 1 x daily - 7 x weekly - 2 sets - 10 reps  ASSESSMENT:  CLINICAL IMPRESSION: Patient arrives for first PT treatment session with mild Lt lateral hip pain. Assessed lumbar ROM given history of lumbar stenosis, but overall she demonstrates good range with mild restriction into extension,  which elicits low back pain. Focused on progression of stretching and strengthening of the Lt hip with good tolerance. With deadlifting she does require occasional  cues to allow for small range of knee flexion with ability to correct once cued. HEP was updated to include further hip strengthening/stretching.   OBJECTIVE IMPAIRMENTS: decreased activity tolerance, decreased strength, impaired flexibility, and pain.   ACTIVITY LIMITATIONS: squatting   PARTICIPATION LIMITATIONS:  gym routine  PERSONAL FACTORS: 1 comorbidity: HTN  are also affecting patient's functional outcome.   REHAB POTENTIAL: Good  CLINICAL DECISION MAKING: Stable/uncomplicated  EVALUATION COMPLEXITY: Low   GOALS: Goals reviewed with patient? Yes  SHORT TERM GOALS: Target date: 12/01/22 The patient will be indep with HEP. Baseline: initiated at eval Goal status: INITIAL  2.  The patient will tolerate step down anteriorly from 4" step. Baseline:  Painful and apprehension for R knee Goal status: INITIAL  3.  The patient will be able to tolerate L sidelying to demo reduced pain L hip. Baseline: Cannot sleep on L side. Goal status: INITIAL  LONG TERM GOALS: Target date: 12/31/22  The patient will be indep with progression of HEP. Baseline:  initiated at eval Goal status: INITIAL  2.  The patient will demonstrate forward lunge R LE without maladaptive compensatory movements. Baseline:  Cannot tolerate today Goal status: INITIAL  3.  The patient will perform wall sit without R knee pain/apprehension. Baseline:  Painful at eval. Goal status: INITIAL  4.  The patient will demo exercise modifications to reduce compensations during class. Baseline:  Loads L LE more -- avoids step ups, step downs, jumping, and lunges with R Goal status: INITIAL   PLAN:  PT FREQUENCY: 1-2x/week  PT DURATION: 8 weeks  PLANNED INTERVENTIONS: Therapeutic exercises, Therapeutic activity, Neuromuscular re-education, Balance  training, Gait training, Patient/Family education, Self Care, Joint mobilization, Dry Needling, Electrical stimulation, Cryotherapy, Moist heat, Taping, and Manual therapy  PLAN FOR NEXT SESSION: Check HEP, work on eccentric control of the R knee, L glut medius strengthening, STM as needed, reduce compensations during gym routine. Letitia Libra, PT, DPT, ATC 11/13/22 12:32 PM  Letitia Libra, PT, DPT, ATC 04/18/23 7:34 AM

## 2022-11-20 DIAGNOSIS — Z6834 Body mass index (BMI) 34.0-34.9, adult: Secondary | ICD-10-CM | POA: Diagnosis not present

## 2022-11-20 DIAGNOSIS — E7800 Pure hypercholesterolemia, unspecified: Secondary | ICD-10-CM | POA: Diagnosis not present

## 2022-11-20 DIAGNOSIS — E559 Vitamin D deficiency, unspecified: Secondary | ICD-10-CM | POA: Diagnosis not present

## 2022-11-22 ENCOUNTER — Ambulatory Visit: Payer: Commercial Managed Care - PPO

## 2022-11-23 DIAGNOSIS — M5416 Radiculopathy, lumbar region: Secondary | ICD-10-CM | POA: Diagnosis not present

## 2022-11-23 DIAGNOSIS — M48062 Spinal stenosis, lumbar region with neurogenic claudication: Secondary | ICD-10-CM | POA: Diagnosis not present

## 2022-11-23 DIAGNOSIS — M5136 Other intervertebral disc degeneration, lumbar region: Secondary | ICD-10-CM | POA: Diagnosis not present

## 2022-11-30 ENCOUNTER — Ambulatory Visit: Payer: Commercial Managed Care - PPO

## 2022-12-05 DIAGNOSIS — Z6833 Body mass index (BMI) 33.0-33.9, adult: Secondary | ICD-10-CM | POA: Diagnosis not present

## 2022-12-05 DIAGNOSIS — E7800 Pure hypercholesterolemia, unspecified: Secondary | ICD-10-CM | POA: Diagnosis not present

## 2022-12-05 DIAGNOSIS — E559 Vitamin D deficiency, unspecified: Secondary | ICD-10-CM | POA: Diagnosis not present

## 2022-12-05 DIAGNOSIS — I10 Essential (primary) hypertension: Secondary | ICD-10-CM | POA: Diagnosis not present

## 2022-12-20 DIAGNOSIS — Z6833 Body mass index (BMI) 33.0-33.9, adult: Secondary | ICD-10-CM | POA: Diagnosis not present

## 2022-12-20 DIAGNOSIS — I10 Essential (primary) hypertension: Secondary | ICD-10-CM | POA: Diagnosis not present

## 2023-01-04 DIAGNOSIS — Z6833 Body mass index (BMI) 33.0-33.9, adult: Secondary | ICD-10-CM | POA: Diagnosis not present

## 2023-01-04 DIAGNOSIS — E7800 Pure hypercholesterolemia, unspecified: Secondary | ICD-10-CM | POA: Diagnosis not present

## 2023-01-04 DIAGNOSIS — E559 Vitamin D deficiency, unspecified: Secondary | ICD-10-CM | POA: Diagnosis not present

## 2023-01-15 DIAGNOSIS — I10 Essential (primary) hypertension: Secondary | ICD-10-CM | POA: Diagnosis not present

## 2023-01-15 DIAGNOSIS — Z6833 Body mass index (BMI) 33.0-33.9, adult: Secondary | ICD-10-CM | POA: Diagnosis not present

## 2023-01-29 DIAGNOSIS — Z6832 Body mass index (BMI) 32.0-32.9, adult: Secondary | ICD-10-CM | POA: Diagnosis not present

## 2023-01-29 DIAGNOSIS — E559 Vitamin D deficiency, unspecified: Secondary | ICD-10-CM | POA: Diagnosis not present

## 2023-02-16 ENCOUNTER — Ambulatory Visit: Payer: Commercial Managed Care - PPO | Admitting: Family

## 2023-02-26 DIAGNOSIS — I10 Essential (primary) hypertension: Secondary | ICD-10-CM | POA: Diagnosis not present

## 2023-02-26 DIAGNOSIS — Z6832 Body mass index (BMI) 32.0-32.9, adult: Secondary | ICD-10-CM | POA: Diagnosis not present

## 2023-03-15 DIAGNOSIS — R635 Abnormal weight gain: Secondary | ICD-10-CM | POA: Diagnosis not present

## 2023-03-15 DIAGNOSIS — E7800 Pure hypercholesterolemia, unspecified: Secondary | ICD-10-CM | POA: Diagnosis not present

## 2023-03-15 DIAGNOSIS — Z6832 Body mass index (BMI) 32.0-32.9, adult: Secondary | ICD-10-CM | POA: Diagnosis not present

## 2023-03-15 DIAGNOSIS — E559 Vitamin D deficiency, unspecified: Secondary | ICD-10-CM | POA: Diagnosis not present

## 2023-03-23 ENCOUNTER — Encounter: Payer: Self-pay | Admitting: Family

## 2023-03-23 ENCOUNTER — Ambulatory Visit: Payer: Commercial Managed Care - PPO | Admitting: Family

## 2023-03-23 VITALS — BP 122/82 | HR 90 | Ht 64.0 in | Wt 187.0 lb

## 2023-03-23 DIAGNOSIS — Z800 Family history of malignant neoplasm of digestive organs: Secondary | ICD-10-CM

## 2023-03-23 DIAGNOSIS — L989 Disorder of the skin and subcutaneous tissue, unspecified: Secondary | ICD-10-CM

## 2023-03-23 DIAGNOSIS — H9190 Unspecified hearing loss, unspecified ear: Secondary | ICD-10-CM

## 2023-03-23 DIAGNOSIS — I10 Essential (primary) hypertension: Secondary | ICD-10-CM | POA: Insufficient documentation

## 2023-03-23 NOTE — Telephone Encounter (Signed)
Sent to [facility].

## 2023-03-23 NOTE — Progress Notes (Signed)
  Teresa Roach is a 43 y.o. female with the following history as recorded in EpicCare:  Patient Active Problem List   Diagnosis Date Noted   Hypertension 03/23/2023   FH: colon cancer 03/23/2023   DDD (degenerative disc disease), lumbar 03/24/2016    Current Outpatient Medications  Medication Sig Dispense Refill   hydrochlorothiazide (HYDRODIURIL) 25 MG tablet 25 mg. one and a half tablet a day     No current facility-administered medications for this visit.    Allergies: Morphine  Past Medical History:  Diagnosis Date   Hypertension    Migraine    Pre-diabetes     No past surgical history on file.  Family History  Problem Relation Age of Onset   Diabetes Mother    Hyperlipidemia Mother    Hypertension Mother    Hyperlipidemia Father    Hypertension Father     Social History   Tobacco Use   Smoking status: Never    Passive exposure: Never   Smokeless tobacco: Never  Substance Use Topics   Alcohol use: Not on file    Comment: very rare drink    Subjective:   Presents today as a new patient; has been having majority of care managed by GYN; has been having fasting labs done with GYN- 11/2022;  Does work with pain management as needed for DDD; will follow up as needed when symptoms flare;  Concerned that hearing is down in both ears- left ear is "not as clear." Has not had to have ear lavage done in the past;   Objective:  Vitals:   03/23/23 1321  BP: 122/82  Pulse: 90  SpO2: 98%  Weight: 187 lb (84.8 kg)  Height: 5\' 4"  (1.626 m)    General: Well developed, well nourished, in no acute distress  Skin : Warm and dry.  Head: Normocephalic and atraumatic  Eyes: Sclera and conjunctiva clear; pupils round and reactive to light; extraocular movements intact  Ears: External normal; canals clear; tympanic membranes normal  Oropharynx: Pink, supple. No suspicious lesions  Neck: Supple without thyromegaly, adenopathy  Lungs: Respirations unlabored; clear to  auscultation bilaterally without wheeze, rales, rhonchi  CVS exam: normal rate and regular rhythm.  Neurologic: Alert and oriented; speech intact; face symmetrical; moves all extremities well; CNII-XII intact without focal deficit   Assessment:  1. Skin lesion   2. Decreased hearing, unspecified laterality   3. FH: colon cancer     Plan:  Referral to dermatology; Referral to ENT for hearing test; Colonoscopy due again in 2027;   No follow-ups on file.  Orders Placed This Encounter  Procedures   Ambulatory referral to Dermatology    Referral Priority:   Routine    Referral Type:   Consultation    Referral Reason:   Specialty Services Required    Referred to Provider:   Terri Piedra, DO    Requested Specialty:   Dermatology    Number of Visits Requested:   1   Ambulatory referral to ENT    Referral Priority:   Routine    Referral Type:   Consultation    Referral Reason:   Specialty Services Required    Requested Specialty:   Otolaryngology    Number of Visits Requested:   1    Requested Prescriptions    No prescriptions requested or ordered in this encounter

## 2023-04-17 DIAGNOSIS — O99211 Obesity complicating pregnancy, first trimester: Secondary | ICD-10-CM | POA: Diagnosis not present

## 2023-04-17 DIAGNOSIS — Z3490 Encounter for supervision of normal pregnancy, unspecified, unspecified trimester: Secondary | ICD-10-CM | POA: Insufficient documentation

## 2023-04-17 DIAGNOSIS — Z114 Encounter for screening for human immunodeficiency virus [HIV]: Secondary | ICD-10-CM | POA: Diagnosis not present

## 2023-04-17 DIAGNOSIS — Z3481 Encounter for supervision of other normal pregnancy, first trimester: Secondary | ICD-10-CM | POA: Diagnosis not present

## 2023-04-17 DIAGNOSIS — Z3A01 Less than 8 weeks gestation of pregnancy: Secondary | ICD-10-CM | POA: Diagnosis not present

## 2023-04-17 DIAGNOSIS — Z1332 Encounter for screening for maternal depression: Secondary | ICD-10-CM | POA: Diagnosis not present

## 2023-04-17 DIAGNOSIS — Z8759 Personal history of other complications of pregnancy, childbirth and the puerperium: Secondary | ICD-10-CM | POA: Diagnosis not present

## 2023-04-17 DIAGNOSIS — Z3687 Encounter for antenatal screening for uncertain dates: Secondary | ICD-10-CM | POA: Diagnosis not present

## 2023-05-04 DIAGNOSIS — O09512 Supervision of elderly primigravida, second trimester: Secondary | ICD-10-CM | POA: Diagnosis not present

## 2023-05-17 ENCOUNTER — Telehealth (INDEPENDENT_AMBULATORY_CARE_PROVIDER_SITE_OTHER): Payer: Self-pay | Admitting: Otolaryngology

## 2023-05-17 NOTE — Telephone Encounter (Signed)
 Confirmed appt & location [id number] afm

## 2023-05-18 ENCOUNTER — Encounter (INDEPENDENT_AMBULATORY_CARE_PROVIDER_SITE_OTHER): Payer: Self-pay

## 2023-05-18 ENCOUNTER — Ambulatory Visit (INDEPENDENT_AMBULATORY_CARE_PROVIDER_SITE_OTHER): Payer: Commercial Managed Care - PPO | Admitting: Otolaryngology

## 2023-05-18 VITALS — BP 128/79 | HR 90 | Ht 64.0 in | Wt 198.0 lb

## 2023-05-18 DIAGNOSIS — H9190 Unspecified hearing loss, unspecified ear: Secondary | ICD-10-CM

## 2023-05-18 NOTE — Progress Notes (Signed)
 Dear Dr. Dayton Scrape, Here is my assessment for our mutual patient, Teresa Roach. Thank you for allowing me the opportunity to care for your patient. Please do not hesitate to contact me should you have any other questions. Sincerely, Dr. Jovita Kussmaul  Otolaryngology Clinic Note Referring provider: Dr. Dayton Scrape HPI:  Teresa Roach is a 43 y.o. female kindly referred by Dr. Dayton Scrape for evaluation of hearing loss.  Patient reports: hearing loss on left, noted about 9-10 months ago. Left side feels "muffled" - "static." Getting worse. Has not noted if worse with particular pitches. Patient denies: ear pain, itch, fullness, vertigo, drainage, tinnitus Patient additionally denies: deep pain in ear canal, eustachian tube symptoms such as popping/crackling, sensitivity to pressure changes Patient also denies barotrauma, vestibular suppressant use, ototoxic medication use Prior ear surgery: no No significant history of ear infections. No early onset FHX of History Loss. No kidney issues. No significant noise exposure.  PMHx: HTN  H&N Surgery: no Personal or FHx of bleeding dz or anesthesia difficulty: no  Tobacco: no. . Occupation: Physician  Independent Review of Additional Tests or Records:  Teresa Roach (FNP) IM 03/23/2023 referral notes: noted subjective hearing loss; no recent audio; Dx: HL; Rx: ref to ENT CMP 03/26/2021: BUN/Cr 16/0.99; CBC 01/02/2013: WBC 4.8, Hgb 13.5, Eos 0 PMH/Meds/All/SocHx/FamHx/ROS:   Past Medical History:  Diagnosis Date   Hypertension    Migraine    Pre-diabetes      History reviewed. No pertinent surgical history.  Family History  Problem Relation Age of Onset   Diabetes Mother    Hyperlipidemia Mother    Hypertension Mother    Hyperlipidemia Father    Hypertension Father      Social Connections: Moderately Integrated (10/25/2022)   [facility]   Social Network    How would you rate your social network (family, work, friends)?:  Adequate participation with social networks      Current Outpatient Medications:    hydrochlorothiazide (HYDRODIURIL) 25 MG tablet, 25 mg. one and a half tablet a day (Patient not taking: Reported on 05/18/2023), Disp: , Rfl:    Physical Exam:   BP 128/79 (BP Location: Left Arm, Patient Position: Sitting, Cuff Size: Large)   Pulse 90   Ht 5\' 4"  (1.626 m)   Wt 198 lb (89.8 kg)   SpO2 98%   BMI 33.99 kg/m   Salient findings:  CN II-XII intact Given history and complaints, ear microscopy was indicated and performed for evaluation with findings as below in physical exam section and in procedures; Bilateral EAC clear and TM intact with well pneumatized middle ear spaces Weber 512: UTA Rinne 512: AC > BC b/l   Anterior rhinoscopy: Septum relatively midline; bilateral inferior turbinates with mild hypertrophy No lesions of oral cavity/oropharynx; dentition good No obviously palpable neck masses/lymphadenopathy/thyromegaly No respiratory distress or stridor  Seprately Identifiable Procedures:  Procedure: Bilateral ear microscopy using microscope (CPT 92504) Pre-procedure diagnosis: hearing loss Post-procedure diagnosis: same Indication: Hearing loss, rule out structural cause; given patient's otologic complaints and history, for improved and comprehensive examination of external ear and tympanic membrane, bilateral otologic examination using microscope was performed  Procedure: Patient was placed semi-recumbent. Both ear canals were examined using the microscope with findings above. Patient tolerated the procedure well.   Impression & Plans:  Teresa Roach is a 43 y.o. female with:  1. Subjective hearing loss    Noted subjective HL; no other symptoms; we discussed etiologies, and next step would be to order audiogram and  obtain audio. Will schedule; f/u PRN depending on audio results   See below regarding exact medications prescribed this encounter including dosages and  route: No orders of the defined types were placed in this encounter.     Thank you for allowing me the opportunity to care for your patient. Please do not hesitate to contact me should you have any other questions.  Sincerely, Jovita Kussmaul, MD Otolaryngologist (ENT), [facility] Phone: [phone] Fax: [phone]  05/18/2023, 2:32 PM   MDM:  Level 3 Complexity/Problems addressed: low Data complexity: mod - independent review of notes, labs, ordering tests - Morbidity: low  - Prescription Drug prescribed or managed: no

## 2023-05-22 DIAGNOSIS — Z113 Encounter for screening for infections with a predominantly sexual mode of transmission: Secondary | ICD-10-CM | POA: Diagnosis not present

## 2023-05-22 DIAGNOSIS — Z118 Encounter for screening for other infectious and parasitic diseases: Secondary | ICD-10-CM | POA: Diagnosis not present

## 2023-05-22 DIAGNOSIS — Z3481 Encounter for supervision of other normal pregnancy, first trimester: Secondary | ICD-10-CM | POA: Diagnosis not present

## 2023-05-22 DIAGNOSIS — Z3A12 12 weeks gestation of pregnancy: Secondary | ICD-10-CM | POA: Diagnosis not present

## 2023-06-07 ENCOUNTER — Other Ambulatory Visit (HOSPITAL_BASED_OUTPATIENT_CLINIC_OR_DEPARTMENT_OTHER): Payer: Self-pay

## 2023-06-07 ENCOUNTER — Telehealth: Admitting: Physician Assistant

## 2023-06-07 DIAGNOSIS — J019 Acute sinusitis, unspecified: Secondary | ICD-10-CM

## 2023-06-07 DIAGNOSIS — B9689 Other specified bacterial agents as the cause of diseases classified elsewhere: Secondary | ICD-10-CM

## 2023-06-07 MED ORDER — AZITHROMYCIN 250 MG PO TABS
ORAL_TABLET | ORAL | 0 refills | Status: AC
  Filled 2023-06-07: qty 6, 5d supply, fill #0

## 2023-06-07 NOTE — Progress Notes (Signed)
 E-Visit for Sinus Problems  We are sorry that you are not feeling well.  Here is how we plan to help!  Based on what you have shared with me it looks like you have sinusitis.  Sinusitis is inflammation and infection in the sinus cavities of the head.  Based on your presentation I believe you most likely have Acute Bacterial Sinusitis.  This is an infection caused by bacteria and is treated with antibiotics. I have prescribed Azithromycin to take as directed since you have tolerated well before.  While this has some coverage for sinusitis, if symptoms are not improving, we may want to consider switch to even plain Amoxicillin as that is also safe in pregnancy.    Saline nasal spray help and can safely be used as often as needed for congestion.  If you develop worsening sinus pain, fever or notice severe headache and vision changes, or if symptoms are not better after completion of antibiotic, please schedule an appointment with a health care provider.    Sinus infections are not as easily transmitted as other respiratory infection, however we still recommend that you avoid close contact with loved ones, especially the very young and elderly.  Remember to wash your hands thoroughly throughout the day as this is the number one way to prevent the spread of infection!  Home Care: Only take medications as instructed by your medical team. Complete the entire course of an antibiotic. Do not take these medications with alcohol. A steam or ultrasonic humidifier can help congestion.  You can place a towel over your head and breathe in the steam from hot water coming from a faucet. Avoid close contacts especially the very young and the elderly. Cover your mouth when you cough or sneeze. Always remember to wash your hands.  Get Help Right Away If: You develop worsening fever or sinus pain. You develop a severe head ache or visual changes. Your symptoms persist after you have completed your treatment  plan.  Make sure you Understand these instructions. Will watch your condition. Will get help right away if you are not doing well or get worse.  Thank you for choosing an e-visit.  Your e-visit answers were reviewed by a board certified advanced clinical practitioner to complete your personal care plan. Depending upon the condition, your plan could have included both over the counter or prescription medications.  Please review your pharmacy choice. Make sure the pharmacy is open so you can pick up prescription now. If there is a problem, you may contact your provider through [employer] and have the prescription routed to another pharmacy.  Your safety is important to [place]. If you have drug allergies check your prescription carefully.   For the next 24 hours you can use MyChart to ask questions about today's visit, request a non-urgent call back, or ask for a work or school excuse. You will get an email in the next two days asking about your experience. I hope that your e-visit has been valuable and will speed your recovery.

## 2023-06-07 NOTE — Progress Notes (Signed)
 I have spent 5 minutes in review of e-visit questionnaire, review and updating patient chart, medical decision making and response to patient.   Piedad Climes, PA-C

## 2023-06-15 ENCOUNTER — Ambulatory Visit (INDEPENDENT_AMBULATORY_CARE_PROVIDER_SITE_OTHER): Admitting: Audiology

## 2023-06-15 DIAGNOSIS — H9042 Sensorineural hearing loss, unilateral, left ear, with unrestricted hearing on the contralateral side: Secondary | ICD-10-CM

## 2023-06-15 DIAGNOSIS — H93292 Other abnormal auditory perceptions, left ear: Secondary | ICD-10-CM

## 2023-06-15 NOTE — Progress Notes (Signed)
  [address], [address] [place], [place] [address] [phone]  Audiological Evaluation    Name: Teresa Roach     DOB:   05-24-1980      MRN:   [id number]                                                                                     Service Date: 06/15/2023     Accompanied by: unaccompanied   Patient comes today after Dr. Tobie, ENT sent a referral for a hearing evaluation due to concerns with hearing loss.   Symptoms Yes Details  Hearing loss  [x]  Left hearing is muffled with onset 9-12 months ago  Tinnitus  [x]  Sometimes, cannot say which ear  Ear pain/ infections/pressure  []    Balance problems  []    Noise exposure history  []    Previous ear surgeries  []    Family history of hearing loss  [x]  Mother- with age  Amplification  []    Other  []      Otoscopy: Right ear: Clear external ear canals and notable landmarks visualized on the tympanic membrane. Left ear:  Clear external ear canals and notable landmarks visualized on the tympanic membrane.  Tympanometry: Right ear: Type A- Normal external ear canal volume with normal middle ear pressure and tympanic membrane compliance Left ear: Type A- Normal external ear canal volume with normal middle ear pressure and tympanic membrane compliance   Pure tone Audiometry: Normal hearing from 125-12kHz in both ears, except for a mild presumably sensorineural hearing loss at [facility] in the left ear.  Speech Audiometry: Right ear- Speech Reception Threshold (SRT) was obtained at 10 dBHL. Left ear-Speech Reception Threshold (SRT) was obtained at 10 dBHL.   Word Recognition Score Tested using NU-6 (MLV) Right ear: 100% was obtained at a presentation level of 50 dBHL with contralateral masking which is deemed as  excellent. Left ear: 100% was obtained at a presentation level of 50 dBHL with contralateral masking which is deemed as  excellent.   The hearing test results were completed under headphones and results are deemed to  be of good reliability. Test technique:  conventional      Recommendations: Follow up with ENT as scheduled for today. Return for a hearing evaluation if concerns with hearing changes arise or per MD recommendation.   Mikea Quadros MARIE LEROUX-MARTINEZ, AUD

## 2023-06-19 DIAGNOSIS — Z363 Encounter for antenatal screening for malformations: Secondary | ICD-10-CM | POA: Diagnosis not present

## 2023-07-17 DIAGNOSIS — Z3A20 20 weeks gestation of pregnancy: Secondary | ICD-10-CM | POA: Diagnosis not present

## 2023-07-17 DIAGNOSIS — Z363 Encounter for antenatal screening for malformations: Secondary | ICD-10-CM | POA: Diagnosis not present

## 2023-08-14 DIAGNOSIS — O358XX1 Maternal care for other (suspected) fetal abnormality and damage, fetus 1: Secondary | ICD-10-CM | POA: Diagnosis not present

## 2023-08-14 DIAGNOSIS — Z3A24 24 weeks gestation of pregnancy: Secondary | ICD-10-CM | POA: Diagnosis not present

## 2023-09-11 DIAGNOSIS — Z23 Encounter for immunization: Secondary | ICD-10-CM | POA: Diagnosis not present

## 2023-09-11 DIAGNOSIS — O09523 Supervision of elderly multigravida, third trimester: Secondary | ICD-10-CM | POA: Diagnosis not present

## 2023-09-11 DIAGNOSIS — R1011 Right upper quadrant pain: Secondary | ICD-10-CM | POA: Diagnosis not present

## 2023-09-11 DIAGNOSIS — Z3A28 28 weeks gestation of pregnancy: Secondary | ICD-10-CM | POA: Diagnosis not present

## 2023-09-11 DIAGNOSIS — Z3483 Encounter for supervision of other normal pregnancy, third trimester: Secondary | ICD-10-CM | POA: Diagnosis not present

## 2023-09-11 DIAGNOSIS — Z3490 Encounter for supervision of normal pregnancy, unspecified, unspecified trimester: Secondary | ICD-10-CM | POA: Diagnosis not present

## 2023-09-12 ENCOUNTER — Ambulatory Visit: Admitting: Dermatology

## 2023-09-12 ENCOUNTER — Encounter: Payer: Self-pay | Admitting: Dermatology

## 2023-09-12 VITALS — BP 135/94 | HR 91

## 2023-09-12 DIAGNOSIS — D485 Neoplasm of uncertain behavior of skin: Secondary | ICD-10-CM | POA: Diagnosis not present

## 2023-09-12 DIAGNOSIS — D2262 Melanocytic nevi of left upper limb, including shoulder: Secondary | ICD-10-CM | POA: Diagnosis not present

## 2023-09-12 DIAGNOSIS — D229 Melanocytic nevi, unspecified: Secondary | ICD-10-CM

## 2023-09-12 NOTE — Patient Instructions (Addendum)

## 2023-09-12 NOTE — Progress Notes (Signed)
   New Patient Visit   Subjective  Teresa Roach is a 43 y.o. female who presents for the following: Concerned about changing moles. Lesions on left foot, present for years, not painful or itching, not previously changing.  Patient is currently [redacted] weeks pregnant.   Lesions on left posterior arm and left arm, not previously treated.   The following portions of the chart were reviewed this encounter and updated as appropriate: medications, allergies, medical history  Review of Systems:  No other skin or systemic complaints except as noted in HPI or Assessment and Plan.  Objective  Well appearing patient in no apparent distress; mood and affect are within normal limits.  A focused examination was performed of the following areas: Bilateral feet, left arm, and back  Relevant exam findings are noted in the Assessment and Plan.  Left Medial Plantar Surface 5mm brown black macule  Left Medial Heel 1.2 cm brown black macule   Assessment & Plan   MELANOCYTIC NEVI- left forearm and left posterior arm Exam: Tan-brown and/or pink-flesh-colored symmetric macules and papules  Treatment Plan: Benign appearing on exam today. Recommend observation. Call clinic for new or changing moles. Recommend daily use of broad spectrum spf 30+ sunscreen to sun-exposed areas.    Neoplasms of Skin- Ddx DN vs benign nevi vs other - Will measure today and monitor for changes - Will see patient again after delivery to evaluate any change - OK to monitor at this time   NEOPLASM OF UNCERTAIN BEHAVIOR OF SKIN (2) Left Medial Plantar Surface Left Medial Heel  Return today (on 09/12/2023) for 6-8 weeks after delivery for mole recheck.  I, Berwyn Lesches, Surg Tech III, am acting as scribe for RUFUS CHRISTELLA HOLY, MD.   Documentation: I have reviewed the above documentation for accuracy and completeness, and I agree with the above.  RUFUS CHRISTELLA HOLY, MD

## 2023-10-02 DIAGNOSIS — Z0000 Encounter for general adult medical examination without abnormal findings: Secondary | ICD-10-CM | POA: Diagnosis not present

## 2023-10-02 DIAGNOSIS — Z01419 Encounter for gynecological examination (general) (routine) without abnormal findings: Secondary | ICD-10-CM | POA: Diagnosis not present

## 2023-10-05 ENCOUNTER — Encounter: Payer: Self-pay | Admitting: Cardiology

## 2023-10-05 ENCOUNTER — Ambulatory Visit: Admitting: Cardiology

## 2023-10-05 VITALS — BP 159/80 | HR 96 | Ht 65.0 in | Wt 228.1 lb

## 2023-10-05 DIAGNOSIS — R011 Cardiac murmur, unspecified: Secondary | ICD-10-CM | POA: Diagnosis not present

## 2023-10-05 DIAGNOSIS — O10013 Pre-existing essential hypertension complicating pregnancy, third trimester: Secondary | ICD-10-CM

## 2023-10-05 DIAGNOSIS — I10 Essential (primary) hypertension: Secondary | ICD-10-CM

## 2023-10-05 DIAGNOSIS — Z79899 Other long term (current) drug therapy: Secondary | ICD-10-CM | POA: Diagnosis not present

## 2023-10-05 DIAGNOSIS — Z3A00 Weeks of gestation of pregnancy not specified: Secondary | ICD-10-CM | POA: Diagnosis not present

## 2023-10-05 MED ORDER — NIFEDIPINE ER OSMOTIC RELEASE 30 MG PO TB24: 30.0000 mg | ORAL_TABLET | Freq: Every evening | ORAL | 3 refills | Status: DC

## 2023-10-05 NOTE — Patient Instructions (Signed)
 Medication Instructions:  Your physician has recommended you make the following change in your medication:  START: Nifedipine  30 mg nightly  *If you need a refill on your cardiac medications before your next appointment, please call your pharmacy*  Lab Work: CMET, Mag  If you have labs (blood work) drawn today and your tests are completely normal, you will receive your results only by: MyChart Message (if you have MyChart) OR A paper copy in the mail If you have any lab test that is abnormal or we need to change your treatment, we will call you to review the results.  Testing/Procedures: Your physician has requested that you have an echocardiogram. Echocardiography is a painless test that uses sound waves to create images of your heart. It provides your doctor with information about the size and shape of your heart and how well your heart's chambers and valves are working. This procedure takes approximately one hour. There are no restrictions for this procedure. Please do NOT wear cologne, perfume, aftershave, or lotions (deodorant is allowed). Please arrive 15 minutes prior to your appointment time.  Please note: We ask at that you not bring children with you during ultrasound (echo/ vascular) testing. Due to room size and safety concerns, children are not allowed in the ultrasound rooms during exams. Our front office staff cannot provide observation of children in our lobby area while testing is being conducted. An adult accompanying a patient to their appointment will only be allowed in the ultrasound room at the discretion of the ultrasound technician under special circumstances. We apologize for any inconvenience.   Follow-Up: At [facility], you and your health needs are our priority.  As part of our continuing mission to provide you with exceptional heart care, our providers are all part of one team.  This team includes your primary Cardiologist (physician) and Advanced Practice  Providers or APPs (Physician Assistants and Nurse Practitioners) who all work together to provide you with the care you need, when you need it.  Your next appointment:    August 19th At 9am  Provider:   Kardie Tobb, DO     Other Instructions Please take your blood pressure daily for 1 week and send in a MyChart message. Please include heart rates. (One message at the end of the 1 weeks).   HOW TO TAKE YOUR BLOOD PRESSURE: Rest 5 minutes before taking your blood pressure. Don't smoke or drink caffeinated beverages for at least 30 minutes before. Take your blood pressure before (not after) you eat. Sit comfortably with your back supported and both feet on the floor (don't cross your legs). Elevate your arm to heart level on a table or a desk. Use the proper sized cuff. It should fit smoothly and snugly around your bare upper arm. There should be enough room to slip a fingertip under the cuff. The bottom edge of the cuff should be 1 inch above the crease of the elbow. Ideally, take 3 measurements at one sitting and record the average.

## 2023-10-05 NOTE — Progress Notes (Signed)
 Cardio-Obstetrics Clinic  New Evaluation  Date:  10/05/2023   ID:  Teresa Roach, DOB Apr 28, 1980, MRN [id number]  PCP:  Jason Leita Repine, FNP   [facility] Providers Cardiologist:  Dub Huntsman, DO  Electrophysiologist:  None       Referring MD: Teresa Nora RAMAN, MD   Chief Complaint:   History of Present Illness:    Teresa Roach is a 43 y.o. female [No obstetric history on file.] who is being seen today for the evaluation of hypertension on pregnancy at the request of Teresa Nora RAMAN, MD.   Medical history of preeclampsia who presents for management of fluctuating blood pressure during her second pregnancy.  She is currently pregnant with her second child. During her first pregnancy, she developed preeclampsia at 34 weeks, leading to induction at 36 weeks. She was previously on hydrochlorothiazide but discontinued it a year and a half ago, opting for dietary modifications and increased physical activity to manage her blood pressure.  In this pregnancy, her blood pressure has been stable for the past month, but a recent reading on July 7th was 135/94 mmHg. She is taking baby aspirin daily with her prenatal vitamins. ```````````````````    Her family history includes hypertension in both parents and congestive heart failure in her 53 year old brother, who also has sleep apnea.   Prior CV Studies Reviewed: The following studies were reviewed today:   Past Medical History:  Diagnosis Date   Hypertension    Migraine    Pre-diabetes     No past surgical history on file.    OB History   No obstetric history on file.         Current Medications: Current Meds  Medication Sig   NIFEdipine  (PROCARDIA -XL/NIFEDICAL-XL) 30 MG 24 hr tablet Take 1 tablet (30 mg total) by mouth at bedtime.   Prenatal MV-Min-Fe Fum-FA-DHA (PRENATAL+DHA PO) Take 1 tablet by mouth daily.     Allergies:   Morphine   Social History   Socioeconomic History   Marital  status: Married    Spouse name: Not on file   Number of children: Not on file   Years of education: Not on file   Highest education level: Not on file  Occupational History   Not on file  Tobacco Use   Smoking status: Never    Passive exposure: Never   Smokeless tobacco: Never  Substance and Sexual Activity   Alcohol use: Not on file    Comment: very rare drink   Drug use: No   Sexual activity: Yes  Other Topics Concern   Not on file  Social History Narrative   ** Merged History Encounter **       Social Drivers of Health   Financial Resource Strain: Low Risk  (10/25/2022)   Received from [facility]   Overall Financial Resource Strain (CARDIA)    Difficulty of Paying Living Expenses: Not hard at all  Food Insecurity: No Food Insecurity (10/25/2022)   Received from [facility]   Hunger Vital Sign    Within the past 12 months, you worried that your food would run out before you got the money to buy more.: Never true    Within the past 12 months, the food you bought just didn't last and you didn't have money to get more.: Never true  Transportation Needs: No Transportation Needs (10/25/2022)   Received from [facility] - Transportation    Lack of Transportation (Medical): No    Lack  of Transportation (Non-Medical): No  Physical Activity: Sufficiently Active (10/25/2022)   Received from [facility]   Exercise Vital Sign    On average, how many days per week do you engage in moderate to strenuous exercise (like a brisk walk)?: 6 days    On average, how many minutes do you engage in exercise at this level?: 50 min  Stress: No Stress Concern Present (10/25/2022)   Received from [facility] of Occupational Health - Occupational Stress Questionnaire    Feeling of Stress : Not at all  Social Connections: Moderately Integrated (10/25/2022)   Received from [facility]   Social Network    How would you rate your social network (family, work,  friends)?: Adequate participation with social networks      Family History  Problem Relation Age of Onset   Diabetes Mother    Hyperlipidemia Mother    Hypertension Mother    Hyperlipidemia Father    Hypertension Father       ROS:   Please see the history of present illness.     All other systems reviewed and are negative.   Labs/EKG Reviewed:    EKG:   EKG was  ordered today.  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   Recent Lipid Panel No results found for: CHOL, TRIG, HDL, CHOLHDL, LDLCALC, LDLDIRECT  Physical Exam:    VS:  BP (!) 159/80 (BP Location: Left Arm)   Pulse 96   Ht 5' 5 (1.651 m)   Wt 228 lb 1.6 oz (103.5 kg)   SpO2 95%   BMI 37.96 kg/m     Wt Readings from Last 3 Encounters:  10/05/23 228 lb 1.6 oz (103.5 kg)  05/18/23 198 lb (89.8 kg)  03/23/23 187 lb (84.8 kg)     GEN:  Well nourished, well developed in no acute distress HEENT: Normal NECK: No JVD; No carotid bruits LYMPHATICS: No lymphadenopathy CARDIAC: RRR, 4/6 holosystolic  murmurs, rubs, gallops RESPIRATORY:  Clear to auscultation without rales, wheezing or rhonchi  ABDOMEN: Soft, non-tender, non-distended MUSCULOSKELETAL:  No edema; No deformity  SKIN: Warm and dry NEUROLOGIC:  Alert and oriented x 3 PSYCHIATRIC:  Normal affect    Risk Assessment/Risk Calculators:     CARPREG II Risk Prediction Index Score:  1.  The patient's risk for a primary cardiac event is 5%.   Modified World Health Organization [facility]) Classification of Maternal CV Risk   Class I         ASSESSMENT & PLAN:    Hypertensive disorder in pregnancy -  blood pressures fluctuating between high 120s and 140s. Increased risk for preeclampsia and postpartum hypertension, especially in Black  women. Emphasized maintaining blood pressure below 130/90 to reduce risks of preeclampsia and other complications, and avoiding 140/90 or higher to prevent preterm delivery before 38 weeks. - Prescribe low dose nifedipine  to be taken at night but she is hesitant to take the medication right now - Monitor blood pressure daily and report readings by next Wednesday. - Schedule follow-up appointment on August 19th at 9 AM. - Order echocardiogram to assess cardiac function.  Heart murmur in pregnancy Newly detected heart murmur, louder than expected in pregnancy. - Order echocardiogram to evaluate heart murmur.  Patient Instructions  Medication Instructions:  Your physician has recommended you  make the following change in your medication:  START: Nifedipine  30 mg nightly  *If you need a refill on your cardiac medications before your next appointment, please call your pharmacy*  Lab Work: CMET, Mag  If you have labs (blood work) drawn today and your tests are completely normal, you will receive your results only by: MyChart Message (if you have MyChart) OR A paper copy in the mail If you have any lab test that is abnormal or we need to change your treatment, we will call you to review the results.  Testing/Procedures: Your physician has requested that you have an echocardiogram. Echocardiography is a painless test that uses sound waves to create images of your heart. It provides your doctor with information about the size and shape of your heart and how well your heart's chambers and valves are working. This procedure takes approximately one hour. There are no restrictions for this procedure. Please do NOT wear cologne, perfume, aftershave, or lotions (deodorant is allowed). Please arrive 15 minutes prior to your appointment time.  Please note: We ask at that you not bring children with you during ultrasound (echo/ vascular) testing. Due to room size and safety concerns, children are not  allowed in the ultrasound rooms during exams. Our front office staff cannot provide observation of children in our lobby area while testing is being conducted. An adult accompanying a patient to their appointment will only be allowed in the ultrasound room at the discretion of the ultrasound technician under special circumstances. We apologize for any inconvenience.   Follow-Up: At [facility], you and your health needs are our priority.  As part of our continuing mission to provide you with exceptional heart care, our providers are all part of one team.  This team includes your primary Cardiologist (physician) and Advanced Practice Providers or APPs (Physician Assistants and Nurse Practitioners) who all work together to provide you with the care you need, when you need it.  Your next appointment:    August 19th At 9am  Provider:   Haider Hornaday, DO     Other Instructions Please take your blood pressure daily for 1 week and send in a MyChart message. Please include heart rates. (One message at the end of the 1 weeks).   HOW TO TAKE YOUR BLOOD PRESSURE: Rest 5 minutes before taking your blood pressure. Don't smoke or drink caffeinated beverages for at least 30 minutes before. Take your blood pressure before (not after) you eat. Sit comfortably with your back supported and both feet on the floor (don't cross your legs). Elevate your arm to heart level on a table or a desk. Use the proper sized cuff. It should fit smoothly and snugly around your bare upper arm. There should be enough room to slip a fingertip under the cuff. The bottom edge of the cuff should be 1 inch above the crease of the elbow. Ideally, take 3 measurements at one sitting and record the average.     Dispo:  No follow-ups on file.   Medication Adjustments/Labs and Tests Ordered: Current medicines are reviewed at length with the patient today.  Concerns regarding medicines are outlined above.  Tests  Ordered: Orders Placed This Encounter  Procedures   Comp Met (CMET)   Magnesium   EKG 12-Lead   ECHOCARDIOGRAM COMPLETE   Medication Changes: Meds ordered this encounter  Medications   NIFEdipine  (PROCARDIA -XL/NIFEDICAL-XL) 30 MG 24 hr tablet    Sig: Take 1 tablet (30 mg total) by mouth at  bedtime.    Dispense:  60 tablet    Refill:  3

## 2023-10-07 DIAGNOSIS — Z3483 Encounter for supervision of other normal pregnancy, third trimester: Secondary | ICD-10-CM | POA: Diagnosis not present

## 2023-10-07 DIAGNOSIS — Z3482 Encounter for supervision of other normal pregnancy, second trimester: Secondary | ICD-10-CM | POA: Diagnosis not present

## 2023-10-09 DIAGNOSIS — O09523 Supervision of elderly multigravida, third trimester: Secondary | ICD-10-CM | POA: Diagnosis not present

## 2023-10-09 DIAGNOSIS — O10113 Pre-existing hypertensive heart disease complicating pregnancy, third trimester: Secondary | ICD-10-CM | POA: Diagnosis not present

## 2023-10-09 DIAGNOSIS — Z3A32 32 weeks gestation of pregnancy: Secondary | ICD-10-CM | POA: Diagnosis not present

## 2023-10-11 ENCOUNTER — Ambulatory Visit (HOSPITAL_COMMUNITY)
Admission: RE | Admit: 2023-10-11 | Discharge: 2023-10-11 | Disposition: A | Discharge status: Home / Self Care | Source: Ambulatory Visit | Attending: Cardiology | Admitting: Cardiology

## 2023-10-11 DIAGNOSIS — R011 Cardiac murmur, unspecified: Secondary | ICD-10-CM | POA: Diagnosis not present

## 2023-10-11 LAB — ECHOCARDIOGRAM COMPLETE
Area-P 1/2: 3.23 cm2
S' Lateral: 2.1 cm

## 2023-10-15 ENCOUNTER — Encounter: Payer: Self-pay | Admitting: *Deleted

## 2023-10-16 ENCOUNTER — Ambulatory Visit: Attending: Cardiology | Admitting: Cardiology

## 2023-10-16 ENCOUNTER — Encounter: Payer: Self-pay | Admitting: Cardiology

## 2023-10-16 VITALS — BP 122/78 | HR 104 | Ht 64.0 in | Wt 222.4 lb

## 2023-10-16 DIAGNOSIS — O10919 Unspecified pre-existing hypertension complicating pregnancy, unspecified trimester: Secondary | ICD-10-CM

## 2023-10-16 DIAGNOSIS — Z3A00 Weeks of gestation of pregnancy not specified: Secondary | ICD-10-CM | POA: Diagnosis not present

## 2023-10-16 MED ORDER — AMLODIPINE BESYLATE 2.5 MG PO TABS: 2.5000 mg | ORAL_TABLET | Freq: Every day | ORAL | 3 refills | Status: DC

## 2023-10-16 NOTE — Progress Notes (Unsigned)
 Teresa Roach

## 2023-10-16 NOTE — Patient Instructions (Addendum)
 Medication Instructions:  Your physician has recommended you make the following change in your medication:  STOP: Nifedipine   START: Amlodipine  2.5 mg once daily *If you need a refill on your cardiac medications before your next appointment, please call your pharmacy*  Follow-Up: At [facility], you and your health needs are our priority.  As part of our continuing mission to provide you with exceptional heart care, our providers are all part of one team.  This team includes your primary Cardiologist (physician) and Advanced Practice Providers or APPs (Physician Assistants and Nurse Practitioners) who all work together to provide you with the care you need, when you need it.  Your next appointment:    Sept 12th at 1:20p  Provider:   Kardie Tobb, DO    Other instructions: Please take your blood pressure daily for 1 week and send in a MyChart message. Please include heart rates. (One message at the end of the 1 week).   HOW TO TAKE YOUR BLOOD PRESSURE: Rest 5 minutes before taking your blood pressure. Don't smoke or drink caffeinated beverages for at least 30 minutes before. Take your blood pressure before (not after) you eat. Sit comfortably with your back supported and both feet on the floor (don't cross your legs). Elevate your arm to heart level on a table or a desk. Use the proper sized cuff. It should fit smoothly and snugly around your bare upper arm. There should be enough room to slip a fingertip under the cuff. The bottom edge of the cuff should be 1 inch above the crease of the elbow. Ideally, take 3 measurements at one sitting and record the average.

## 2023-10-16 NOTE — Progress Notes (Unsigned)
 Cardio-Obstetrics Clinic  New Evaluation  Date:  10/19/2023   ID:  Teresa Roach, DOB 1980/03/21, MRN [id number]  PCP:  Jason Leita Repine, FNP   [facility] Providers Cardiologist:  Dub Huntsman, DO  Electrophysiologist:  None       Referring MD: Jason Leita Repine,*   Chief Complaint:   History of Present Illness:    Teresa Roach is a 43 y.o. female here for follow-up visit.  Medical history of preeclampsia who presents for management of fluctuating blood pressure during her second pregnancy.  She is currently pregnant with her second child. During her first pregnancy, she developed preeclampsia at 34 weeks, leading to induction at 36 weeks. She was previously on hydrochlorothiazide but discontinued it a year and a half ago, opting for dietary modifications and increased physical activity to manage her blood pressure.  At her last visit I was concerned about her blood pressure being elevated - started nifedipine . She took a couple of doses but experienced headache. This was stopped. She has been active.   No shortness of breath reported    Prior CV Studies Reviewed: The following studies were reviewed today:   Past Medical History:  Diagnosis Date   Hypertension    Migraine    Pre-diabetes     History reviewed. No pertinent surgical history.    OB History   No obstetric history on file.         Current Medications: Current Meds  Medication Sig   amLODipine  (NORVASC ) 2.5 MG tablet Take 1 tablet (2.5 mg total) by mouth daily.   aspirin EC (ASPIRIN 81) 81 MG tablet    Prenatal MV-Min-Fe Fum-FA-DHA (PRENATAL+DHA PO) Take 1 tablet by mouth daily.     Allergies:   Morphine   Social History   Socioeconomic History   Marital status: Married    Spouse name: Not on file   Number of children: Not on file   Years of education: Not on file   Highest education level: Not on file  Occupational History   Not on file  Tobacco Use   Smoking  status: Never    Passive exposure: Never   Smokeless tobacco: Never  Substance and Sexual Activity   Alcohol use: Not on file    Comment: very rare drink   Drug use: No   Sexual activity: Yes  Other Topics Concern   Not on file  Social History Narrative   ** Merged History Encounter **       Social Drivers of Health   Financial Resource Strain: Low Risk  (10/25/2022)   Received from [facility]   Overall Financial Resource Strain (CARDIA)    Difficulty of Paying Living Expenses: Not hard at all  Food Insecurity: No Food Insecurity (10/25/2022)   Received from [facility]   Hunger Vital Sign    Within the past 12 months, you worried that your food would run out before you got the money to buy more.: Never true    Within the past 12 months, the food you bought just didn't last and you didn't have money to get more.: Never true  Transportation Needs: No Transportation Needs (10/25/2022)   Received from [facility] - Transportation    Lack of Transportation (Medical): No    Lack of Transportation (Non-Medical): No  Physical Activity: Sufficiently Active (10/25/2022)   Received from [facility]   Exercise Vital Sign    On average, how many days per week do you engage  in moderate to strenuous exercise (like a brisk walk)?: 6 days    On average, how many minutes do you engage in exercise at this level?: 50 min  Stress: No Stress Concern Present (10/25/2022)   Received from [facility] of Occupational Health - Occupational Stress Questionnaire    Feeling of Stress : Not at all  Social Connections: Moderately Integrated (10/25/2022)   Received from [facility]   Social Network    How would you rate your social network (family, work, friends)?: Adequate participation with social networks      Family History  Problem Relation Age of Onset   Diabetes Mother    Hyperlipidemia Mother    Hypertension Mother    Hyperlipidemia Father     Hypertension Father       ROS:   Please see the history of present illness.     All other systems reviewed and are negative.   Labs/EKG Reviewed:    EKG:   EKG was  ordered today.  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   Recent Lipid Panel No results found for: CHOL, TRIG, HDL, CHOLHDL, LDLCALC, LDLDIRECT  Physical Exam:    VS:  BP 122/78   Pulse (!) 104   Ht 5' 4 (1.626 m)   Wt 222 lb 6.4 oz (100.9 kg)   SpO2 98%   BMI 38.17 kg/m     Wt Readings from Last 3 Encounters:  10/16/23 222 lb 6.4 oz (100.9 kg)  10/05/23 228 lb 1.6 oz (103.5 kg)  05/18/23 198 lb (89.8 kg)     GEN:  Well nourished, well developed in no acute distress HEENT: Normal NECK: No JVD; No carotid bruits LYMPHATICS: No lymphadenopathy CARDIAC: RRR, 4/6 holosystolic  murmurs, rubs, gallops RESPIRATORY:  Clear to auscultation without rales, wheezing or rhonchi  ABDOMEN: Soft, non-tender, non-distended MUSCULOSKELETAL:  No edema; No deformity  SKIN: Warm and dry NEUROLOGIC:  Alert and oriented x 3 PSYCHIATRIC:  Normal affect    Risk Assessment/Risk Calculators:                  ASSESSMENT & PLAN:    Hypertensive disorder in pregnancy -  she did not tolerate the nifedipine  due to headache. We will need to monitor her closely. She will take her blood pressure daily  if this is > 130/80 mmHg she needs to be started to antihypertensive medication. Amlodipine  2.5 mg has been prescribe. It is important to control her blood pressure because her echo shows moderate LVH which I believe is due to previous uncontrolled blood pressure. 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   Cont aspirin for preeclampsia prophylaxis.    Patient Instructions   Medication Instructions:  Your physician has recommended you make the following change in your medication:  STOP: Nifedipine   START: Amlodipine  2.5 mg once daily *If you need a refill on your cardiac medications before your next appointment, please call your pharmacy*  Follow-Up: At [facility], you and your health needs are our priority.  As part of our continuing mission to provide you with exceptional heart care, our providers are all part of one team.  This team includes your primary Cardiologist (physician) and Advanced Practice Providers or APPs (Physician Assistants and Nurse Practitioners) who  all work together to provide you with the care you need, when you need it.  Your next appointment:    Sept 12th at 1:20p  Provider:   Tayo Maute, DO    Other instructions: Please take your blood pressure daily for 1 week and send in a MyChart message. Please include heart rates. (One message at the end of the 1 week).   HOW TO TAKE YOUR BLOOD PRESSURE: Rest 5 minutes before taking your blood pressure. Don't smoke or drink caffeinated beverages for at least 30 minutes before. Take your blood pressure before (not after) you eat. Sit comfortably with your back supported and both feet on the floor (don't cross your legs). Elevate your arm to heart level on a table or a desk. Use the proper sized cuff. It should fit smoothly and snugly around your bare upper arm. There should be enough room to slip a fingertip under the cuff. The bottom edge of the cuff should be 1 inch above the crease of the elbow. Ideally, take 3 measurements at one sitting and record the average.       Dispo:  No follow-ups on file.   Medication Adjustments/Labs and Tests Ordered: Current medicines are reviewed at length with the patient today.  Concerns regarding medicines are outlined above.  Tests Ordered: No orders of the defined types were placed in this encounter.  Medication Changes: Meds  ordered this encounter  Medications   amLODipine  (NORVASC ) 2.5 MG tablet    Sig: Take 1 tablet (2.5 mg total) by mouth daily.    Dispense:  90 tablet    Refill:  3

## 2023-10-24 DIAGNOSIS — Z3685 Encounter for antenatal screening for Streptococcus B: Secondary | ICD-10-CM | POA: Diagnosis not present

## 2023-10-24 DIAGNOSIS — O10113 Pre-existing hypertensive heart disease complicating pregnancy, third trimester: Secondary | ICD-10-CM | POA: Diagnosis not present

## 2023-10-24 DIAGNOSIS — Z3483 Encounter for supervision of other normal pregnancy, third trimester: Secondary | ICD-10-CM | POA: Diagnosis not present

## 2023-10-24 DIAGNOSIS — O09523 Supervision of elderly multigravida, third trimester: Secondary | ICD-10-CM | POA: Diagnosis not present

## 2023-10-24 DIAGNOSIS — Z113 Encounter for screening for infections with a predominantly sexual mode of transmission: Secondary | ICD-10-CM | POA: Diagnosis not present

## 2023-10-31 DIAGNOSIS — O09523 Supervision of elderly multigravida, third trimester: Secondary | ICD-10-CM | POA: Diagnosis not present

## 2023-10-31 DIAGNOSIS — O99213 Obesity complicating pregnancy, third trimester: Secondary | ICD-10-CM | POA: Diagnosis not present

## 2023-10-31 DIAGNOSIS — O10113 Pre-existing hypertensive heart disease complicating pregnancy, third trimester: Secondary | ICD-10-CM | POA: Diagnosis not present

## 2023-11-06 DIAGNOSIS — O09523 Supervision of elderly multigravida, third trimester: Secondary | ICD-10-CM | POA: Diagnosis not present

## 2023-11-06 DIAGNOSIS — Z3A36 36 weeks gestation of pregnancy: Secondary | ICD-10-CM | POA: Diagnosis not present

## 2023-11-06 DIAGNOSIS — O10113 Pre-existing hypertensive heart disease complicating pregnancy, third trimester: Secondary | ICD-10-CM | POA: Diagnosis not present

## 2023-11-09 ENCOUNTER — Ambulatory Visit (INDEPENDENT_AMBULATORY_CARE_PROVIDER_SITE_OTHER): Admitting: Cardiology

## 2023-11-09 ENCOUNTER — Encounter: Payer: Self-pay | Admitting: Cardiology

## 2023-11-09 ENCOUNTER — Other Ambulatory Visit (HOSPITAL_BASED_OUTPATIENT_CLINIC_OR_DEPARTMENT_OTHER): Payer: Self-pay

## 2023-11-09 VITALS — BP 138/78 | HR 84 | Ht 65.0 in | Wt 231.7 lb

## 2023-11-09 DIAGNOSIS — O10919 Unspecified pre-existing hypertension complicating pregnancy, unspecified trimester: Secondary | ICD-10-CM

## 2023-11-09 DIAGNOSIS — Z3A36 36 weeks gestation of pregnancy: Secondary | ICD-10-CM

## 2023-11-09 MED ORDER — FUROSEMIDE 20 MG PO TABS: 20.0000 mg | ORAL_TABLET | Freq: Every day | ORAL | 0 refills | Status: DC

## 2023-11-09 MED ORDER — POTASSIUM CHLORIDE ER 10 MEQ PO TBCR
10.0000 meq | EXTENDED_RELEASE_TABLET | Freq: Every day | ORAL | 0 refills | Status: DC
  Filled 2023-11-09: qty 3, 3d supply, fill #0

## 2023-11-09 MED ORDER — FUROSEMIDE 20 MG PO TABS
20.0000 mg | ORAL_TABLET | Freq: Every day | ORAL | 0 refills | Status: DC
Start: 2023-11-09 — End: 2023-11-09
  Filled 2023-11-09: qty 3, 3d supply, fill #0

## 2023-11-09 MED ORDER — POTASSIUM CHLORIDE ER 10 MEQ PO TBCR
10.0000 meq | EXTENDED_RELEASE_TABLET | Freq: Every day | ORAL | 0 refills | Status: DC
Start: 2023-11-09 — End: 2023-11-23

## 2023-11-09 NOTE — Patient Instructions (Signed)
 Medication Instructions:  Your physician has recommended you make the following change in your medication: For 3 days: Lasix  20 mg once daily  For 3 days: Potassium 10 mEq once daily  *If you need a refill on your cardiac medications before your next appointment, please call your pharmacy*  Follow-Up: At [facility], you and your health needs are our priority.  As part of our continuing mission to provide you with exceptional heart care, our providers are all part of one team.  This team includes your primary Cardiologist (physician) and Advanced Practice Providers or APPs (Physician Assistants and Nurse Practitioners) who all work together to provide you with the care you need, when you need it.

## 2023-11-11 NOTE — Progress Notes (Signed)
 Cardio-Obstetrics Clinic  Follow Up Note   Date:  11/11/2023   ID:  Teresa Roach, DOB Feb 07, 1981, MRN [id number]  PCP:  Jason Leita Repine, FNP   [facility] Providers Cardiologist:  Dub Huntsman, DO  Electrophysiologist:  None        Referring MD: Jason Leita Repine,*   Chief Complaint:  I am doing well  History of Present Illness:    Teresa Roach is a 43 y.o. female [No obstetric history on file.] who returns for follow up of chronic hypertension in pregnanc, her echo showed moderate left ventricular hypertrophy  At her last visit, Amlodipine  was sent to her pharmacy for chronic hypertension. She tells me that at home her blood pressure has been controled so she did not need to start this medicine.   She monitors her blood pressure at home, which remains generally stable in the 120s, with some fluctuations.   She is scheduled for a C-section at 38 weeks on November 19, 2023. During her previous pregnancy, she experienced shortness of breath around day three or four postpartum, which she associates with her hypertension.   Prior CV Studies Reviewed: The following studies were reviewed today: echo  Past Medical History:  Diagnosis Date   Hypertension    Migraine    Pre-diabetes     No past surgical history on file.    OB History   No obstetric history on file.         Current Medications: Current Meds  Medication Sig   amLODipine  (NORVASC ) 2.5 MG tablet Take 1 tablet (2.5 mg total) by mouth daily.   aspirin EC (ASPIRIN 81) 81 MG tablet    Prenatal MV-Min-Fe Fum-FA-DHA (PRENATAL+DHA PO) Take 1 tablet by mouth daily.   [DISCONTINUED] furosemide  (LASIX ) 20 MG tablet Take 1 tablet (20 mg total) by mouth daily for 3 days.   [DISCONTINUED] potassium chloride  (KLOR-CON ) 10 MEQ tablet Take 1 tablet (10 mEq total) by mouth daily for 3 days.     Allergies:   Morphine   Social History   Socioeconomic History   Marital status: Married     Spouse name: Not on file   Number of children: Not on file   Years of education: Not on file   Highest education level: Not on file  Occupational History   Not on file  Tobacco Use   Smoking status: Never    Passive exposure: Never   Smokeless tobacco: Never  Substance and Sexual Activity   Alcohol use: Not on file    Comment: very rare drink   Drug use: No   Sexual activity: Yes  Other Topics Concern   Not on file  Social History Narrative   ** Merged History Encounter **       Social Drivers of Health   Financial Resource Strain: Low Risk  (10/25/2022)   Received from [facility]   Overall Financial Resource Strain (CARDIA)    Difficulty of Paying Living Expenses: Not hard at all  Food Insecurity: No Food Insecurity (10/25/2022)   Received from [facility]   Hunger Vital Sign    Within the past 12 months, you worried that your food would run out before you got the money to buy more.: Never true    Within the past 12 months, the food you bought just didn't last and you didn't have money to get more.: Never true  Transportation Needs: No Transportation Needs (10/25/2022)   Received from [facility] - Transportation  Lack of Transportation (Medical): No    Lack of Transportation (Non-Medical): No  Physical Activity: Sufficiently Active (10/25/2022)   Received from [facility]   Exercise Vital Sign    On average, how many days per week do you engage in moderate to strenuous exercise (like a brisk walk)?: 6 days    On average, how many minutes do you engage in exercise at this level?: 50 min  Stress: No Stress Concern Present (10/25/2022)   Received from [facility] - Occupational Stress Questionnaire    Feeling of Stress : Not at all  Social Connections: Moderately Integrated (10/25/2022)   Received from [facility]   Social Network    How would you rate your social network (family, work, friends)?: Adequate  participation with social networks      Family History  Problem Relation Age of Onset   Diabetes Mother    Hyperlipidemia Mother    Hypertension Mother    Hyperlipidemia Father    Hypertension Father       ROS:   Please see the history of present illness.     All other systems reviewed and are negative.   Labs/EKG Reviewed:    EKG:  None   Recent Labs: No results found for requested labs within last 365 days.   Recent Lipid Panel No results found for: CHOL, TRIG, HDL, CHOLHDL, LDLCALC, LDLDIRECT  Physical Exam:    VS:  BP 138/78   Pulse 84   Ht 5' 5 (1.651 m)   Wt 231 lb 11.2 oz (105.1 kg)   SpO2 90%   BMI 38.56 kg/m     Wt Readings from Last 3 Encounters:  11/09/23 231 lb 11.2 oz (105.1 kg)  10/16/23 222 lb 6.4 oz (100.9 kg)  10/05/23 228 lb 1.6 oz (103.5 kg)     GEN:  Well nourished, well developed in no acute distress HEENT: Normal NECK: No JVD; No carotid bruits LYMPHATICS: No lymphadenopathy CARDIAC: RRR, no murmurs, rubs, gallops RESPIRATORY:  Clear to auscultation without rales, wheezing or rhonchi  ABDOMEN: Soft, non-tender, non-distended MUSCULOSKELETAL:  No edema; No deformity  SKIN: Warm and dry NEUROLOGIC:  Alert and oriented x 3 PSYCHIATRIC:  Normal affect    Risk Assessment/Risk Calculators:     CARPREG II Risk Prediction Index Score:  1.  The patient's risk for a primary cardiac event is 5%.   Modified World Health Organization [facility]) Classification of Maternal CV Risk   Class I         ASSESSMENT & PLAN:    Chronic hypertension complicating pregnancy Chronic hypertension with increased risk for postpartum complications such as preeclampsia. Blood pressure is well-managed in the 120s.  - Ensure availability of amlodipine  at home. - Prescribe Lasix  20 mg for three days postpartum to manage fluid shifts. - Advise to monitor blood pressure closely postpartum. - Instruct to contact via MyChart if Lasix  is not available  post-discharge. - Advise to take potassium supplements if needed with Lasix .  Planned repeat cesarean delivery at [redacted] weeks gestation Scheduled for a repeat cesarean delivery on November 19, 2023, at [redacted] weeks gestation.    Patient Instructions  Medication Instructions:  Your physician has recommended you make the following change in your medication: For 3 days: Lasix  20 mg once daily  For 3 days: Potassium 10 mEq once daily  *If you need a refill on your cardiac medications before your next appointment, please call your pharmacy*  Follow-Up: At  [facility], you and your health needs are our priority.  As part of our continuing mission to provide you with exceptional heart care, our providers are all part of one team.  This team includes your primary Cardiologist (physician) and Advanced Practice Providers or APPs (Physician Assistants and Nurse Practitioners) who all work together to provide you with the care you need, when you need it.            Dispo:  No follow-ups on file.   Medication Adjustments/Labs and Tests Ordered: Current medicines are reviewed at length with the patient today.  Concerns regarding medicines are outlined above.  Tests Ordered: No orders of the defined types were placed in this encounter.  Medication Changes: Meds ordered this encounter  Medications   DISCONTD: furosemide  (LASIX ) 20 MG tablet    Sig: Take 1 tablet (20 mg total) by mouth daily for 3 days.    Dispense:  3 tablet    Refill:  0   DISCONTD: potassium chloride  (KLOR-CON ) 10 MEQ tablet    Sig: Take 1 tablet (10 mEq total) by mouth daily for 3 days.    Dispense:  3 tablet    Refill:  0   furosemide  (LASIX ) 20 MG tablet    Sig: Take 1 tablet (20 mg total) by mouth daily for 3 days.    Dispense:  3 tablet    Refill:  0   potassium chloride  (KLOR-CON ) 10 MEQ tablet    Sig: Take 1 tablet (10 mEq total) by mouth daily for 3 days.    Dispense:  3 tablet    Refill:  0

## 2023-11-13 DIAGNOSIS — O10113 Pre-existing hypertensive heart disease complicating pregnancy, third trimester: Secondary | ICD-10-CM | POA: Diagnosis not present

## 2023-11-13 DIAGNOSIS — Z3A37 37 weeks gestation of pregnancy: Secondary | ICD-10-CM | POA: Diagnosis not present

## 2023-11-13 DIAGNOSIS — O09523 Supervision of elderly multigravida, third trimester: Secondary | ICD-10-CM | POA: Diagnosis not present

## 2023-11-19 DIAGNOSIS — O34219 Maternal care for unspecified type scar from previous cesarean delivery: Secondary | ICD-10-CM | POA: Diagnosis not present

## 2023-11-19 DIAGNOSIS — R011 Cardiac murmur, unspecified: Secondary | ICD-10-CM | POA: Diagnosis not present

## 2023-11-19 DIAGNOSIS — Z302 Encounter for sterilization: Secondary | ICD-10-CM | POA: Diagnosis not present

## 2023-11-19 DIAGNOSIS — O34211 Maternal care for low transverse scar from previous cesarean delivery: Secondary | ICD-10-CM | POA: Diagnosis not present

## 2023-11-19 DIAGNOSIS — O1092 Unspecified pre-existing hypertension complicating childbirth: Secondary | ICD-10-CM | POA: Diagnosis not present

## 2023-11-19 DIAGNOSIS — K219 Gastro-esophageal reflux disease without esophagitis: Secondary | ICD-10-CM | POA: Diagnosis not present

## 2023-11-19 DIAGNOSIS — Z3A38 38 weeks gestation of pregnancy: Secondary | ICD-10-CM | POA: Diagnosis not present

## 2023-11-19 DIAGNOSIS — O9942 Diseases of the circulatory system complicating childbirth: Secondary | ICD-10-CM | POA: Diagnosis not present

## 2023-11-19 DIAGNOSIS — I517 Cardiomegaly: Secondary | ICD-10-CM | POA: Diagnosis not present

## 2023-11-19 DIAGNOSIS — O9962 Diseases of the digestive system complicating childbirth: Secondary | ICD-10-CM | POA: Diagnosis not present

## 2023-11-20 DIAGNOSIS — O34211 Maternal care for low transverse scar from previous cesarean delivery: Secondary | ICD-10-CM | POA: Diagnosis not present

## 2023-11-20 DIAGNOSIS — Z302 Encounter for sterilization: Secondary | ICD-10-CM | POA: Diagnosis not present

## 2023-11-20 DIAGNOSIS — O9942 Diseases of the circulatory system complicating childbirth: Secondary | ICD-10-CM | POA: Diagnosis not present

## 2023-11-20 DIAGNOSIS — O1092 Unspecified pre-existing hypertension complicating childbirth: Secondary | ICD-10-CM | POA: Diagnosis not present

## 2023-11-23 ENCOUNTER — Encounter: Payer: Self-pay | Admitting: Cardiology

## 2023-11-23 ENCOUNTER — Other Ambulatory Visit: Payer: Self-pay

## 2023-11-23 MED ORDER — FUROSEMIDE 40 MG PO TABS: ORAL_TABLET | ORAL | 0 refills | Status: DC

## 2023-11-23 MED ORDER — AMLODIPINE BESYLATE 5 MG PO TABS: 5.0000 mg | ORAL_TABLET | Freq: Every day | ORAL | 3 refills | Status: DC

## 2023-11-23 MED ORDER — POTASSIUM CHLORIDE CRYS ER 20 MEQ PO TBCR: EXTENDED_RELEASE_TABLET | ORAL | 0 refills | Status: DC

## 2023-11-23 NOTE — Addendum Note (Signed)
 Addended by: DRENA MARTINIS, Helayne Metsker L on: 11/23/2023 01:34 PM   Modules accepted: Orders

## 2023-11-30 ENCOUNTER — Other Ambulatory Visit (HOSPITAL_BASED_OUTPATIENT_CLINIC_OR_DEPARTMENT_OTHER): Payer: Self-pay

## 2023-11-30 ENCOUNTER — Ambulatory Visit: Admitting: Pharmacist

## 2023-11-30 VITALS — BP 145/95 | HR 81

## 2023-11-30 DIAGNOSIS — E669 Obesity, unspecified: Secondary | ICD-10-CM | POA: Insufficient documentation

## 2023-11-30 DIAGNOSIS — N838 Other noninflammatory disorders of ovary, fallopian tube and broad ligament: Secondary | ICD-10-CM | POA: Insufficient documentation

## 2023-11-30 DIAGNOSIS — Z98891 History of uterine scar from previous surgery: Secondary | ICD-10-CM | POA: Insufficient documentation

## 2023-11-30 DIAGNOSIS — Z8759 Personal history of other complications of pregnancy, childbirth and the puerperium: Secondary | ICD-10-CM | POA: Insufficient documentation

## 2023-11-30 DIAGNOSIS — O139 Gestational [pregnancy-induced] hypertension without significant proteinuria, unspecified trimester: Secondary | ICD-10-CM | POA: Insufficient documentation

## 2023-11-30 DIAGNOSIS — I10 Essential (primary) hypertension: Secondary | ICD-10-CM | POA: Diagnosis not present

## 2023-11-30 DIAGNOSIS — R92333 Mammographic heterogeneous density, bilateral breasts: Secondary | ICD-10-CM | POA: Insufficient documentation

## 2023-11-30 DIAGNOSIS — A6000 Herpesviral infection of urogenital system, unspecified: Secondary | ICD-10-CM | POA: Insufficient documentation

## 2023-11-30 MED ORDER — AMLODIPINE BESYLATE 10 MG PO TABS
10.0000 mg | ORAL_TABLET | Freq: Every day | ORAL | 3 refills | Status: AC
  Filled 2023-11-30 – 2024-02-23 (×3): qty 90, 90d supply, fill #0

## 2023-11-30 NOTE — Patient Instructions (Addendum)
 It was nice meeting you today  Your blood pressure is a little elevated today  Increase amlodipine  to 10mg  daily and continue to monitor at home

## 2023-11-30 NOTE — Progress Notes (Signed)
 Patient ID: Teresa Roach                 DOB: [birth year]                      MRN: [id number]     HPI: Teresa Roach is a 43 y.o. female referred to Cardio OB clinic for postpartum HTN. Delivered on 11/19/23.  Patient presents today to discuss HTN management. Currently on amlodipine  5mg  once daily. Has small amount of lower extremity edema. Unclear if due to amlodipine  or pregnancy.  She is breastfeeding but using a bottle since infant having difficulty latching.  Reports chest pain/tightness with SOB. She thought it was due to gas.  Is sleeping in recliner due to discomfort from surgery.  Home BP readings have been between 125-138/80-90 with pulse of 75.  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  Current HTN meds: amlodipine  5mg  daily  Wt Readings from Last 3 Encounters:  11/09/23 231 lb 11.2 oz (105.1 kg)  10/16/23 222 lb 6.4 oz (100.9 kg)  10/05/23 228 lb 1.6 oz (103.5 kg)   BP Readings from Last 3 Encounters:  11/30/23 (!) 145/95  11/09/23 138/78  10/16/23 122/78   Pulse Readings from Last 3 Encounters:  11/30/23 81  11/09/23 84  10/16/23 (!) 104    Renal function: CrCl cannot be calculated (Patient's most recent lab result is older than the maximum 21 days allowed.).  Past Medical History:  Diagnosis Date   Hypertension    Migraine    Pre-diabetes     Current Outpatient Medications on File Prior to Visit  Medication Sig Dispense Refill   ANUSOL-HC 2.5 % rectal cream Place 1 Application rectally 4 (four) times daily.     ibuprofen (ADVIL) 800 MG tablet Take 800 mg by mouth every 8 (eight) hours as needed.     aspirin EC (ASPIRIN 81) 81 MG tablet      Prenatal MV-Min-Fe Fum-FA-DHA (PRENATAL+DHA PO) Take 1 tablet by mouth daily.     No current facility-administered medications on file prior to visit.    Allergies  Allergen Reactions   Morphine Itching     Assessment/Plan:  1. Hypertension - Patient BP elevated in room today  at 146/90 and rechecked at 145/95. Discussed with Dr Sheena and will order EKG due to HTN and chest pain.  Recommend addition of enalapril due to safety in breastfeeding but patient is hesitant. Would prefer to increase amlodipine  to 10mg  daily. Recommend continuing to check BP at home and report back and concerns.  Increase amlodipine  to 10mg  daily Check EKG Check labs per Dr Sheena  Medford Bolk, PharmD, BCACP, CDCES, CPP [facility] [address] [place], [place] [address] Phone: [phone]; Fax: [phone] 12/03/2023 9:14 AM

## 2023-12-03 ENCOUNTER — Encounter: Payer: Self-pay | Admitting: Pharmacist

## 2023-12-10 ENCOUNTER — Other Ambulatory Visit (HOSPITAL_COMMUNITY): Payer: Self-pay

## 2023-12-13 ENCOUNTER — Ambulatory Visit: Admitting: Cardiology

## 2023-12-14 ENCOUNTER — Ambulatory Visit: Admitting: Pharmacist

## 2023-12-24 DIAGNOSIS — Z1331 Encounter for screening for depression: Secondary | ICD-10-CM | POA: Diagnosis not present

## 2023-12-24 DIAGNOSIS — D508 Other iron deficiency anemias: Secondary | ICD-10-CM | POA: Diagnosis not present

## 2024-01-08 ENCOUNTER — Ambulatory Visit: Admitting: Dermatology

## 2024-01-21 ENCOUNTER — Other Ambulatory Visit (HOSPITAL_COMMUNITY): Payer: Self-pay

## 2024-01-29 DIAGNOSIS — N938 Other specified abnormal uterine and vaginal bleeding: Secondary | ICD-10-CM | POA: Diagnosis not present

## 2024-02-23 ENCOUNTER — Other Ambulatory Visit (HOSPITAL_COMMUNITY): Payer: Self-pay

## 2024-03-25 ENCOUNTER — Other Ambulatory Visit (HOSPITAL_COMMUNITY): Payer: Self-pay
